# Patient Record
Sex: Male | Born: 1935 | Race: White | Hispanic: No | Marital: Single | Smoking: Never smoker
Health system: Southern US, Community
[De-identification: ages and names within clinical notes are randomized; demographics above are authoritative.]

## PROBLEM LIST (undated history)

## (undated) DIAGNOSIS — I499 Cardiac arrhythmia, unspecified: Secondary | ICD-10-CM

## (undated) DIAGNOSIS — G4730 Sleep apnea, unspecified: Secondary | ICD-10-CM

## (undated) DIAGNOSIS — N200 Calculus of kidney: Secondary | ICD-10-CM

## (undated) DIAGNOSIS — E119 Type 2 diabetes mellitus without complications: Secondary | ICD-10-CM

## (undated) DIAGNOSIS — M1990 Unspecified osteoarthritis, unspecified site: Secondary | ICD-10-CM

## (undated) DIAGNOSIS — I2510 Atherosclerotic heart disease of native coronary artery without angina pectoris: Secondary | ICD-10-CM

## (undated) DIAGNOSIS — B029 Zoster without complications: Secondary | ICD-10-CM

## (undated) DIAGNOSIS — I10 Essential (primary) hypertension: Secondary | ICD-10-CM

## (undated) DIAGNOSIS — D126 Benign neoplasm of colon, unspecified: Secondary | ICD-10-CM

## (undated) DIAGNOSIS — Z8719 Personal history of other diseases of the digestive system: Secondary | ICD-10-CM

## (undated) DIAGNOSIS — I429 Cardiomyopathy, unspecified: Secondary | ICD-10-CM

## (undated) DIAGNOSIS — I421 Obstructive hypertrophic cardiomyopathy: Secondary | ICD-10-CM

## (undated) DIAGNOSIS — M109 Gout, unspecified: Secondary | ICD-10-CM

## (undated) HISTORY — PX: ROTATOR CUFF REPAIR: SHX139

## (undated) HISTORY — PX: OTHER SURGICAL HISTORY: SHX169

## (undated) HISTORY — PX: CARDIAC DEFIBRILLATOR PLACEMENT: SHX171

## (undated) HISTORY — PX: CARDIAC CATHETERIZATION: SHX172

## (undated) HISTORY — PX: APPENDECTOMY: SHX54

## (undated) HISTORY — PX: COLONOSCOPY: SHX174

---

## 2004-09-10 ENCOUNTER — Ambulatory Visit: Payer: Self-pay | Admitting: Cardiology

## 2005-08-01 ENCOUNTER — Other Ambulatory Visit: Payer: Self-pay

## 2005-08-01 ENCOUNTER — Ambulatory Visit: Payer: Self-pay | Admitting: Urology

## 2005-08-04 ENCOUNTER — Ambulatory Visit: Payer: Self-pay | Admitting: Urology

## 2005-12-06 ENCOUNTER — Ambulatory Visit: Payer: Self-pay | Admitting: Nurse Practitioner

## 2007-07-08 ENCOUNTER — Other Ambulatory Visit: Payer: Self-pay

## 2007-07-08 ENCOUNTER — Inpatient Hospital Stay: Payer: Self-pay | Admitting: Internal Medicine

## 2007-07-26 ENCOUNTER — Inpatient Hospital Stay: Payer: Self-pay | Admitting: Cardiology

## 2007-07-27 ENCOUNTER — Other Ambulatory Visit: Payer: Self-pay

## 2007-08-29 ENCOUNTER — Ambulatory Visit: Payer: Self-pay | Admitting: Cardiology

## 2007-09-13 ENCOUNTER — Ambulatory Visit: Payer: Self-pay | Admitting: Gastroenterology

## 2007-09-14 ENCOUNTER — Ambulatory Visit: Payer: Self-pay | Admitting: Gastroenterology

## 2007-10-05 ENCOUNTER — Emergency Department: Payer: Self-pay | Admitting: Emergency Medicine

## 2007-10-05 ENCOUNTER — Other Ambulatory Visit: Payer: Self-pay

## 2007-10-06 ENCOUNTER — Ambulatory Visit: Payer: Self-pay | Admitting: Cardiology

## 2007-10-17 ENCOUNTER — Ambulatory Visit: Payer: Self-pay | Admitting: Cardiology

## 2007-10-31 ENCOUNTER — Encounter: Payer: Self-pay | Admitting: Family Medicine

## 2008-01-04 ENCOUNTER — Emergency Department: Payer: Self-pay | Admitting: Emergency Medicine

## 2008-04-11 ENCOUNTER — Ambulatory Visit: Payer: Self-pay | Admitting: Cardiology

## 2008-04-18 ENCOUNTER — Inpatient Hospital Stay: Payer: Self-pay | Admitting: Cardiology

## 2008-11-05 ENCOUNTER — Ambulatory Visit: Payer: Self-pay | Admitting: Cardiology

## 2008-12-09 ENCOUNTER — Ambulatory Visit: Payer: Self-pay | Admitting: Gastroenterology

## 2009-09-11 ENCOUNTER — Ambulatory Visit: Payer: Self-pay | Admitting: Family Medicine

## 2009-09-15 ENCOUNTER — Ambulatory Visit: Payer: Self-pay | Admitting: Family Medicine

## 2009-10-13 ENCOUNTER — Ambulatory Visit: Payer: Self-pay | Admitting: Family Medicine

## 2009-11-13 ENCOUNTER — Ambulatory Visit: Payer: Self-pay | Admitting: Family Medicine

## 2010-01-21 ENCOUNTER — Ambulatory Visit: Payer: Self-pay | Admitting: Rheumatology

## 2010-05-06 ENCOUNTER — Ambulatory Visit: Payer: Self-pay | Admitting: Family Medicine

## 2010-06-01 ENCOUNTER — Ambulatory Visit: Payer: Self-pay | Admitting: Urology

## 2010-06-10 ENCOUNTER — Ambulatory Visit: Payer: Self-pay | Admitting: Urology

## 2010-06-11 ENCOUNTER — Ambulatory Visit: Payer: Self-pay | Admitting: Cardiology

## 2010-08-30 ENCOUNTER — Ambulatory Visit: Payer: Self-pay | Admitting: Vascular Surgery

## 2010-09-14 NOTE — Assessment & Plan Note (Signed)
Summary: FLU SHOT/JBB  Nurse Visit   Immunizations Administered:  Influenza Vaccine:    Vaccine Type: Flulaval    Site: left deltoid    Mfr: GlaxoSmithKline    Dose: 0.5 ml    Route: IM    Given by: Tacey Ruiz MD    Exp. Date: 12/14/2010    Lot #: [id number]    VIS given: 03/09/10 version given May 06, 2010.  Orders Added: 1)  Flulaval Injection 3 yrs >IM [Q2036] 2)  Administration Flu vaccine - MCR [G0008]   Immunizations Administered:  Influenza Vaccine:    Vaccine Type: Flulaval    Site: left deltoid    Mfr: GlaxoSmithKline    Dose: 0.5 ml    Route: IM    Given by: Tacey Ruiz MD    Exp. Date: 12/14/2010    Lot #: [id number]    VIS given: 03/09/10 version given May 06, 2010.  Flu Vaccine Consent Questions:    Do you have a history of severe allergic reactions to this vaccine? no    Any prior history of allergic reactions to egg and/or gelatin? no    Do you have a sensitivity to the preservative Thimersol? no    Do you have a past history of Guillan-Barre Syndrome? no    Do you currently have an acute febrile illness? no    Have you ever had a severe reaction to latex? no    Vaccine information given and explained to patient? yes

## 2011-11-01 IMAGING — CT CT ANGIOGRAPHY ABDOMEN
2 of 5 series · 17 of 46 positions shown, 19 images · non-contrast
Comparison: none

REASON FOR EXAM: AAA
COMMENTS:

PROCEDURE:     KCT - KCT ANGIOGRAPHY ABDOMEN ONLY WWO  - August 30, 2010  [DATE]
RESULT:
HISTORY: Abdominal aortic aneurysm.

[Series 6: soft tissue · axial · 0.92mm/px · z∈[+200,+622]mm · 14 of 163 slices shown, 16 images]
[im 11/163  soft-tissue]
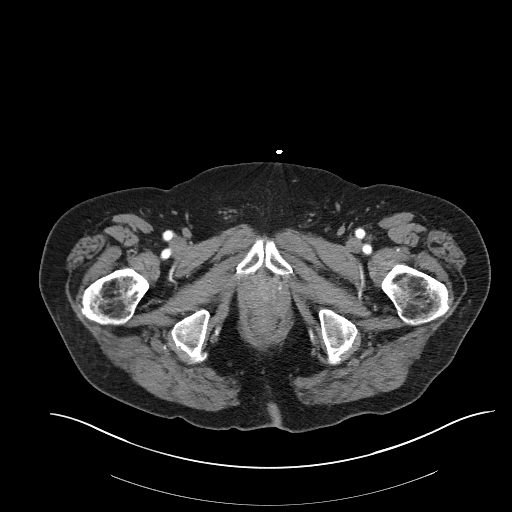
[im 11/163  bone]
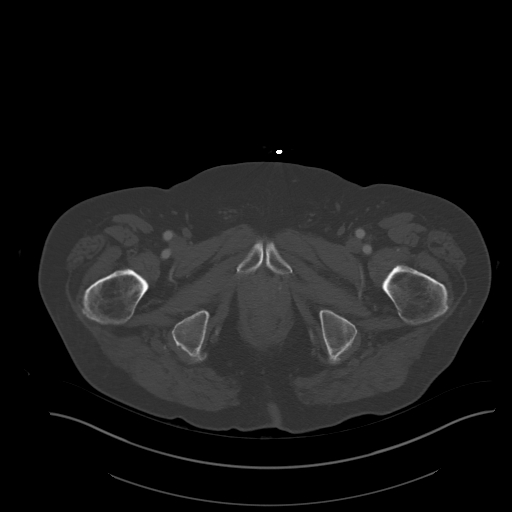
[im 21/163  soft-tissue]
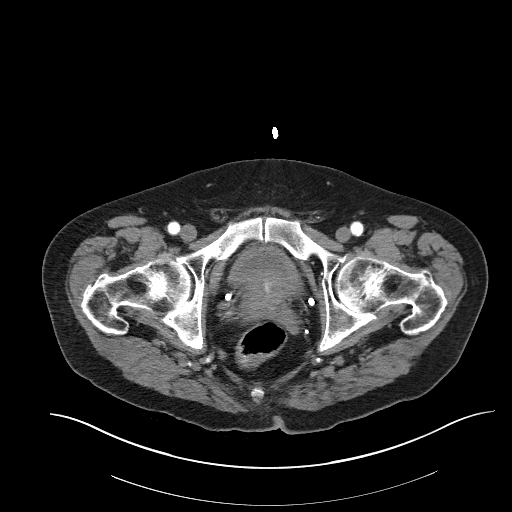
[im 31/163  soft-tissue]
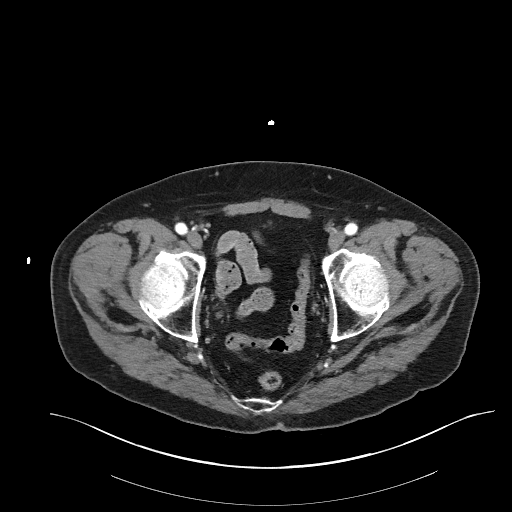
[im 41/163  soft-tissue]
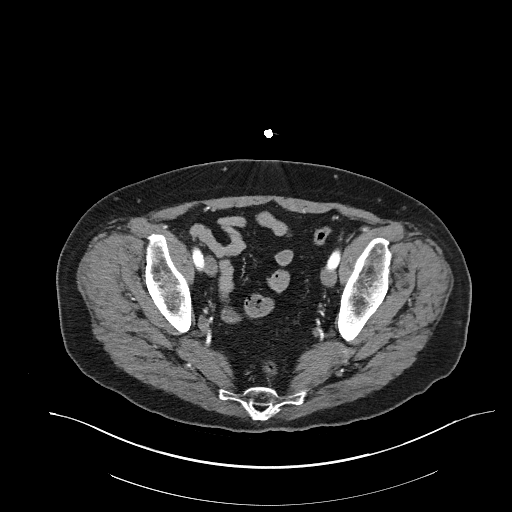
[im 51/163  soft-tissue]
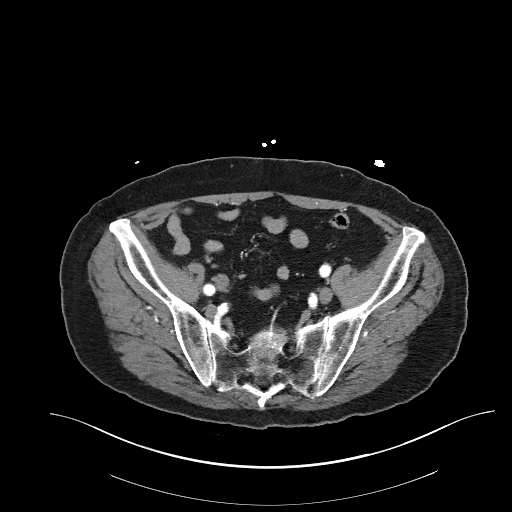
[im 61/163  soft-tissue]
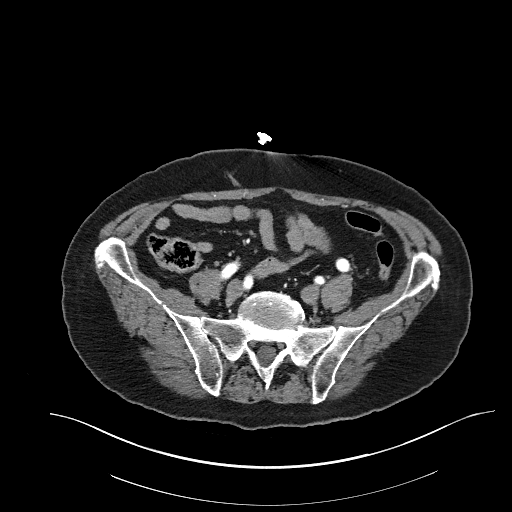
[im 71/163  soft-tissue]
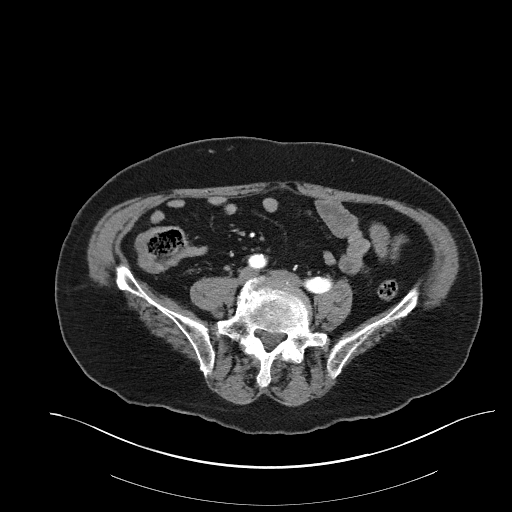
[im 92/163  soft-tissue]
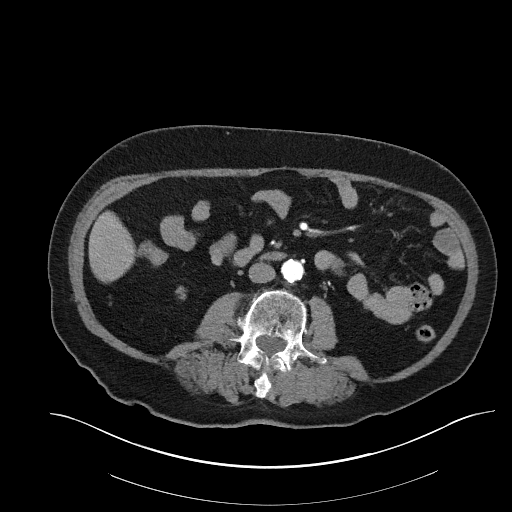
[im 102/163  soft-tissue]
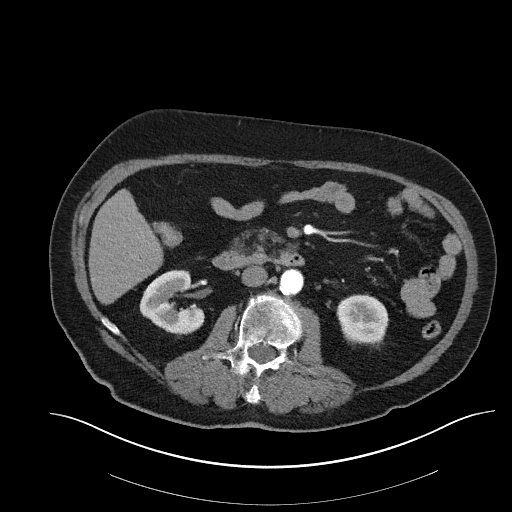
[im 102/163  bone]
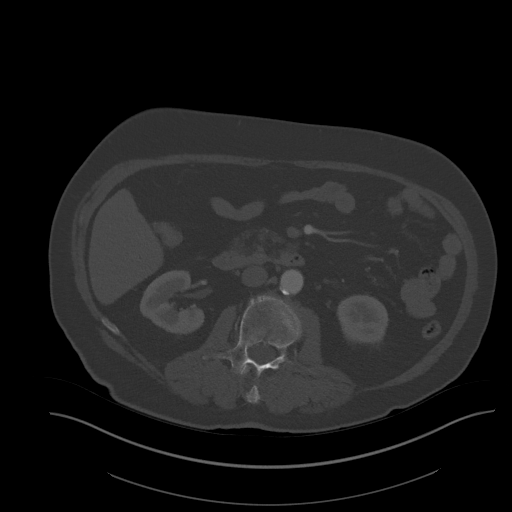
[im 112/163  soft-tissue]
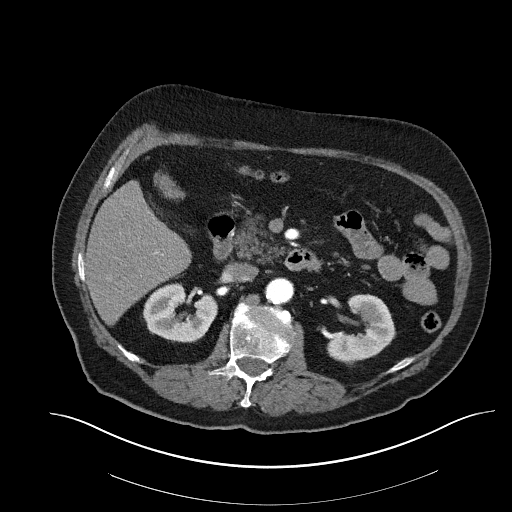
[im 122/163  soft-tissue]
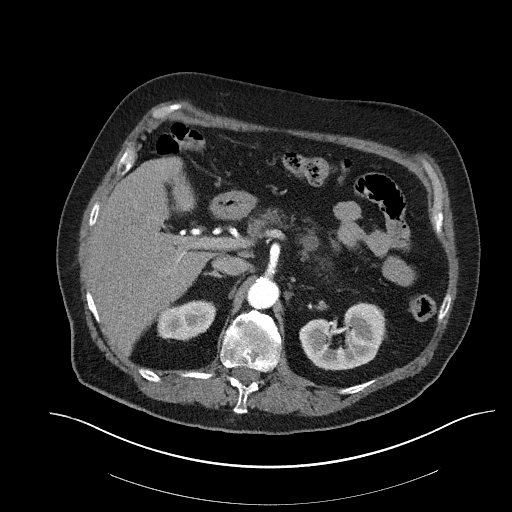
[im 132/163  soft-tissue]
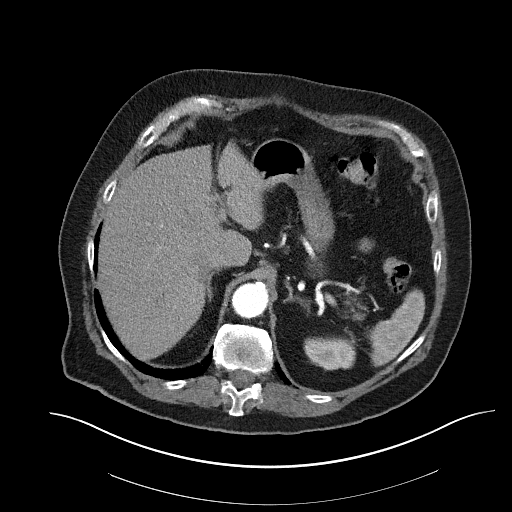
[im 142/163  soft-tissue]
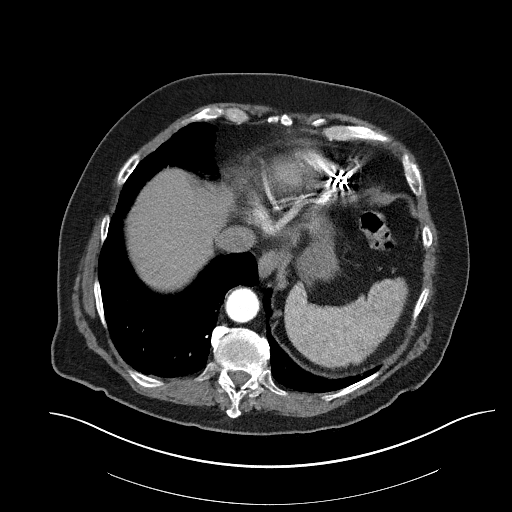
[im 152/163  soft-tissue]
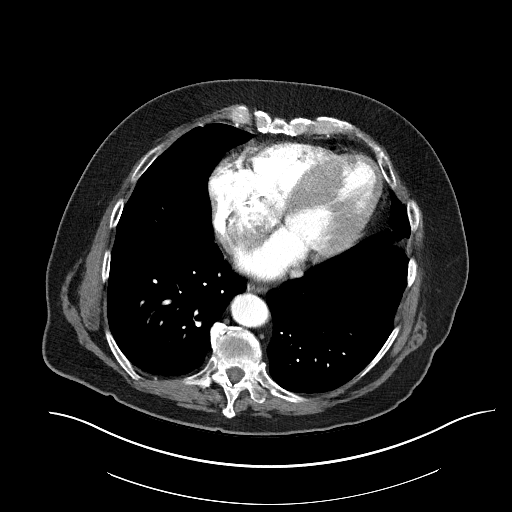

[Series 603: cor · coronal · 0.95mm/px · 3 of 60 slices shown]
[im 20/60  soft-tissue]
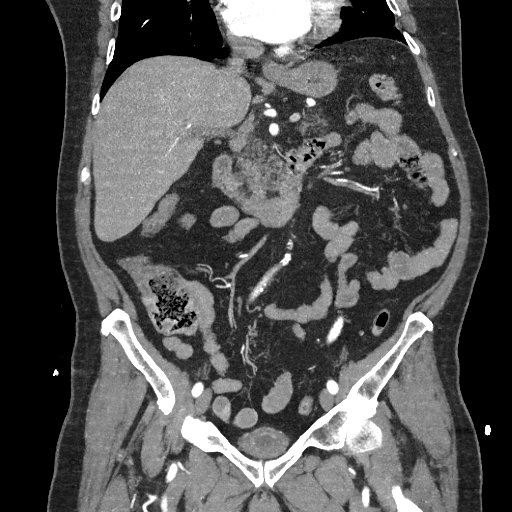
[im 27/60  soft-tissue]
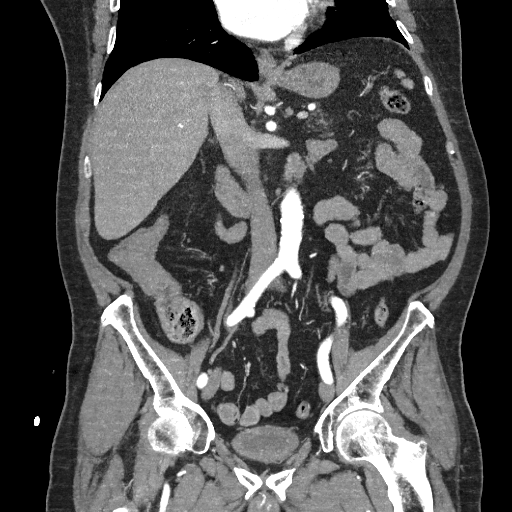
[im 33/60  soft-tissue]
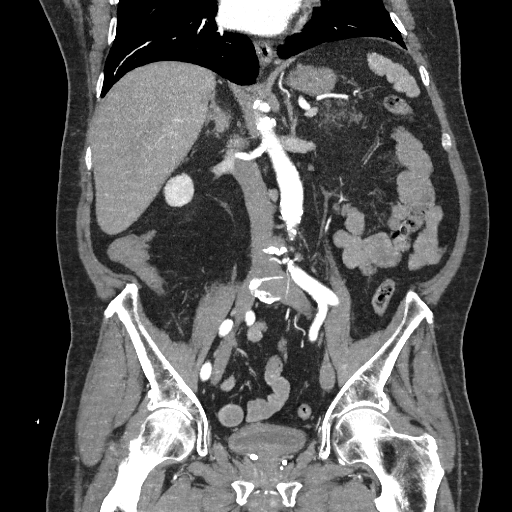

[17 of 46 positions shown; findings below may reference images not displayed]

PROCEDURE AND FINDINGS:  Standard IV contrast enhanced CT angiography of the
abdominal aorta is obtained with 100 ml of Wsovue-HZH. Axial source images,
MIP images and volume rendered images are obtained.

Comparison is made to prior CT of abdomen of 07/26/2007.

The patient has a pacemaker. Right nephrolithiasis is noted. The liver,
spleen and pancreas are unremarkable. The adrenals are unremarkable. No
focal significant renal cortical lesions are identified. Simple, right renal
cysts are present. Diffuse aortic and iliac atherosclerotic vascular disease
s present. No evidence of aneurysm. The single major renal arteries are
present and are patent with mild atherosclerotic vascular changes. The
visceral vessels are patent. The hypogastrics, external iliacs, common
iliacs and proximal SFA and deep femoral arteries are patent.
IMPRESSION: 1.  No significant aneurysm.
2.  Right nephrolithiasis. Tiny, right renal cysts are present. Mild
scarring is also noted in the right lower renal pole.

## 2012-06-19 ENCOUNTER — Ambulatory Visit: Payer: Self-pay | Admitting: Cardiology

## 2012-10-20 ENCOUNTER — Emergency Department: Payer: Self-pay | Admitting: Internal Medicine

## 2013-02-03 ENCOUNTER — Emergency Department: Payer: Self-pay | Admitting: Emergency Medicine

## 2013-02-03 LAB — URINALYSIS, COMPLETE
Bilirubin,UR: NEGATIVE
Glucose,UR: NEGATIVE mg/dL (ref 0–75)
Ketone: NEGATIVE
Leukocyte Esterase: NEGATIVE
Nitrite: POSITIVE
Protein: NEGATIVE
RBC,UR: 7 /HPF (ref 0–5)
Specific Gravity: 1.017 (ref 1.003–1.030)
Squamous Epithelial: <1
WBC UR: 2 /HPF (ref 0–5)

## 2013-02-04 LAB — URINE CULTURE

## 2013-12-19 ENCOUNTER — Ambulatory Visit: Payer: Self-pay | Admitting: Cardiology

## 2014-07-07 ENCOUNTER — Ambulatory Visit: Payer: Self-pay | Admitting: Orthopedic Surgery

## 2015-02-10 ENCOUNTER — Encounter
Admission: RE | Admit: 2015-02-10 | Discharge: 2015-02-10 | Disposition: A | Discharge status: Home / Self Care | Payer: Medicare Other | Source: Ambulatory Visit | Attending: Cardiology | Admitting: Cardiology

## 2015-02-10 ENCOUNTER — Ambulatory Visit
Admission: RE | Admit: 2015-02-10 | Discharge: 2015-02-10 | Disposition: A | Discharge status: Home / Self Care | Payer: Medicare Other | Source: Ambulatory Visit | Attending: Cardiology | Admitting: Cardiology

## 2015-02-10 DIAGNOSIS — I10 Essential (primary) hypertension: Secondary | ICD-10-CM | POA: Insufficient documentation

## 2015-02-10 DIAGNOSIS — Z01818 Encounter for other preprocedural examination: Secondary | ICD-10-CM | POA: Diagnosis not present

## 2015-02-10 DIAGNOSIS — I2510 Atherosclerotic heart disease of native coronary artery without angina pectoris: Secondary | ICD-10-CM | POA: Insufficient documentation

## 2015-02-10 DIAGNOSIS — Z7901 Long term (current) use of anticoagulants: Secondary | ICD-10-CM | POA: Insufficient documentation

## 2015-02-10 DIAGNOSIS — Z45010 Encounter for checking and testing of cardiac pacemaker pulse generator [battery]: Secondary | ICD-10-CM

## 2015-02-10 DIAGNOSIS — Z01810 Encounter for preprocedural cardiovascular examination: Secondary | ICD-10-CM | POA: Diagnosis not present

## 2015-02-10 DIAGNOSIS — Z01812 Encounter for preprocedural laboratory examination: Secondary | ICD-10-CM | POA: Diagnosis not present

## 2015-02-10 HISTORY — DX: Type 2 diabetes mellitus without complications: E11.9

## 2015-02-10 HISTORY — DX: Gout, unspecified: M10.9

## 2015-02-10 HISTORY — DX: Benign neoplasm of colon, unspecified: D12.6

## 2015-02-10 HISTORY — DX: Cardiomyopathy, unspecified: I42.9

## 2015-02-10 HISTORY — DX: Essential (primary) hypertension: I10

## 2015-02-10 HISTORY — DX: Zoster without complications: B02.9

## 2015-02-10 HISTORY — DX: Unspecified osteoarthritis, unspecified site: M19.90

## 2015-02-10 HISTORY — DX: Obstructive hypertrophic cardiomyopathy: I42.1

## 2015-02-10 HISTORY — DX: Calculus of kidney: N20.0

## 2015-02-10 HISTORY — DX: Personal history of other diseases of the digestive system: Z87.19

## 2015-02-10 HISTORY — DX: Cardiac arrhythmia, unspecified: I49.9

## 2015-02-10 HISTORY — DX: Sleep apnea, unspecified: G47.30

## 2015-02-10 HISTORY — DX: Atherosclerotic heart disease of native coronary artery without angina pectoris: I25.10

## 2015-02-10 LAB — CBC
HEMATOCRIT: 39.4 % — AB (ref 40.0–52.0)
Hemoglobin: 13.5 g/dL (ref 13.0–18.0)
MCH: 34.4 pg — ABNORMAL HIGH (ref 26.0–34.0)
MCHC: 34.3 g/dL (ref 32.0–36.0)
MCV: 100.2 fL — AB (ref 80.0–100.0)
Platelets: 134 10*3/uL — ABNORMAL LOW (ref 150–440)
RBC: 3.93 MIL/uL — AB (ref 4.40–5.90)
RDW: 13.1 % (ref 11.5–14.5)
WBC: 6.2 10*3/uL (ref 3.8–10.6)

## 2015-02-10 LAB — BASIC METABOLIC PANEL
Anion gap: 8 (ref 5–15)
BUN: 17 mg/dL (ref 6–20)
CO2: 25 mmol/L (ref 22–32)
Calcium: 9.6 mg/dL (ref 8.9–10.3)
Chloride: 108 mmol/L (ref 101–111)
Creatinine, Ser: 0.63 mg/dL (ref 0.61–1.24)
GFR calc non Af Amer: >60 mL/min (ref >60)
Glucose, Bld: 120 mg/dL — ABNORMAL HIGH (ref 65–99)
POTASSIUM: 4 mmol/L (ref 3.5–5.1)
Sodium: 141 mmol/L (ref 135–145)

## 2015-02-10 LAB — DIFFERENTIAL
BASOS PCT: 1 %
Basophils Absolute: 0.1 10*3/uL (ref 0–0.1)
Eosinophils Absolute: 0.1 10*3/uL (ref 0–0.7)
Eosinophils Relative: 2 %
LYMPHS ABS: 2.1 10*3/uL (ref 1.0–3.6)
Lymphocytes Relative: 34 %
MONO ABS: 0.8 10*3/uL (ref 0.2–1.0)
MONOS PCT: 12 %
NEUTROS ABS: 3.2 10*3/uL (ref 1.4–6.5)
Neutrophils Relative %: 51 %

## 2015-02-10 LAB — APTT: APTT: 37 s — AB (ref 24–36)

## 2015-02-10 LAB — PROTIME-INR
INR: 1.96
PROTHROMBIN TIME: 22.5 s — AB (ref 11.4–15.0)

## 2015-02-10 NOTE — Patient Instructions (Addendum)
  Your procedure is scheduled on: Thursday 02/19/15 Report to Day Surgery. 2ND FLOOR MEDICAL MALL ENTRANCE To find out your arrival time please call [phone] between 1PM - 3PM on Wednesday 02/18/15.  Remember: Instructions that are not followed completely may result in serious medical risk, up to and including death, or upon the discretion of your surgeon and anesthesiologist your surgery may need to be rescheduled.    __X__ 1. Do not eat food or drink liquids after midnight. No gum chewing or hard candies.     __X__ 2. No Alcohol for 24 hours before or after surgery.   ____ 3. Bring all medications with you on the day of surgery if instructed.    __X__ 4. Notify your doctor if there is any change in your medical condition     (cold, fever, infections).     Do not wear jewelry, make-up, hairpins, clips or nail polish.  Do not wear lotions, powders, or perfumes.   Do not shave 48 hours prior to surgery. Men may shave face and neck.  Do not bring valuables to the hospital.    [facility] is not responsible for any belongings or valuables.               Contacts, dentures or bridgework may not be worn into surgery.  Leave your suitcase in the car. After surgery it may be brought to your room.  For patients admitted to the hospital, discharge time is determined by your                treatment team.   Patients discharged the day of surgery will not be allowed to drive home.   Please read over the following fact sheets that you were given:   Surgical Site Infection Prevention   __X__ Take these medicines the morning of surgery with A SIP OF WATER:    1. METOPROLOL  2.   3.   4.  5.  6.  ____ Fleet Enema (as directed)   __X__ Use CHG Soap as directed  ____ Use inhalers on the day of surgery  __X__ Stop metformin 2 days prior to surgery    ____ Take 1/2 of usual insulin dose the night before surgery and none on the morning of surgery.   __X__ Stop Coumadin/Plavix/aspirin  on  TO STOP ON 7/1 WARFARIN AND ASPIRIN  ____ Stop Anti-inflammatories on    __X__ Stop supplements until after surgery.  OMEGA 3, B12, VITAMIN C  __X__ Bring C-Pap to the hospital.

## 2015-02-19 ENCOUNTER — Ambulatory Visit
Admission: RE | Admit: 2015-02-19 | Discharge: 2015-02-19 | Disposition: A | Discharge status: Home / Self Care | Payer: Medicare Other | Source: Ambulatory Visit | Attending: Cardiology | Admitting: Cardiology

## 2015-02-19 ENCOUNTER — Encounter
Admission: RE | Disposition: A | Discharge status: Home / Self Care | Payer: Self-pay | Source: Ambulatory Visit | Attending: Cardiology

## 2015-02-19 ENCOUNTER — Ambulatory Visit: Payer: Medicare Other | Admitting: Anesthesiology

## 2015-02-19 ENCOUNTER — Encounter: Payer: Self-pay | Admitting: *Deleted

## 2015-02-19 DIAGNOSIS — Z79899 Other long term (current) drug therapy: Secondary | ICD-10-CM | POA: Insufficient documentation

## 2015-02-19 DIAGNOSIS — E119 Type 2 diabetes mellitus without complications: Secondary | ICD-10-CM | POA: Insufficient documentation

## 2015-02-19 DIAGNOSIS — I421 Obstructive hypertrophic cardiomyopathy: Secondary | ICD-10-CM | POA: Diagnosis present

## 2015-02-19 DIAGNOSIS — Z8261 Family history of arthritis: Secondary | ICD-10-CM | POA: Diagnosis not present

## 2015-02-19 DIAGNOSIS — Z7901 Long term (current) use of anticoagulants: Secondary | ICD-10-CM | POA: Diagnosis not present

## 2015-02-19 DIAGNOSIS — I10 Essential (primary) hypertension: Secondary | ICD-10-CM | POA: Diagnosis not present

## 2015-02-19 DIAGNOSIS — E785 Hyperlipidemia, unspecified: Secondary | ICD-10-CM | POA: Diagnosis not present

## 2015-02-19 DIAGNOSIS — Z87442 Personal history of urinary calculi: Secondary | ICD-10-CM | POA: Insufficient documentation

## 2015-02-19 DIAGNOSIS — Z803 Family history of malignant neoplasm of breast: Secondary | ICD-10-CM | POA: Diagnosis not present

## 2015-02-19 DIAGNOSIS — I442 Atrioventricular block, complete: Secondary | ICD-10-CM | POA: Insufficient documentation

## 2015-02-19 DIAGNOSIS — G4730 Sleep apnea, unspecified: Secondary | ICD-10-CM | POA: Diagnosis not present

## 2015-02-19 DIAGNOSIS — I482 Chronic atrial fibrillation: Secondary | ICD-10-CM | POA: Diagnosis not present

## 2015-02-19 DIAGNOSIS — Z45010 Encounter for checking and testing of cardiac pacemaker pulse generator [battery]: Secondary | ICD-10-CM | POA: Diagnosis not present

## 2015-02-19 DIAGNOSIS — M109 Gout, unspecified: Secondary | ICD-10-CM | POA: Insufficient documentation

## 2015-02-19 DIAGNOSIS — I2510 Atherosclerotic heart disease of native coronary artery without angina pectoris: Secondary | ICD-10-CM | POA: Diagnosis not present

## 2015-02-19 DIAGNOSIS — Z8349 Family history of other endocrine, nutritional and metabolic diseases: Secondary | ICD-10-CM | POA: Diagnosis not present

## 2015-02-19 DIAGNOSIS — Z7982 Long term (current) use of aspirin: Secondary | ICD-10-CM | POA: Insufficient documentation

## 2015-02-19 DIAGNOSIS — Z86010 Personal history of colonic polyps: Secondary | ICD-10-CM | POA: Insufficient documentation

## 2015-02-19 DIAGNOSIS — Z833 Family history of diabetes mellitus: Secondary | ICD-10-CM | POA: Diagnosis not present

## 2015-02-19 DIAGNOSIS — Z8249 Family history of ischemic heart disease and other diseases of the circulatory system: Secondary | ICD-10-CM | POA: Diagnosis not present

## 2015-02-19 HISTORY — PX: PACEMAKER INSERTION: SHX728

## 2015-02-19 LAB — PROTIME-INR
INR: 1.16
PROTHROMBIN TIME: 15 s (ref 11.4–15.0)

## 2015-02-19 LAB — GLUCOSE, CAPILLARY
GLUCOSE-CAPILLARY: 149 mg/dL — AB (ref 65–99)
GLUCOSE-CAPILLARY: 120 mg/dL — AB (ref 65–99)

## 2015-02-19 SURGERY — INSERTION, CARDIAC PACEMAKER
Anesthesia: Monitor Anesthesia Care

## 2015-02-19 MED ORDER — CEFAZOLIN SODIUM 1-5 GM-% IV SOLN: 1.0000 g | Freq: Four times a day (QID) | INTRAVENOUS | Status: DC

## 2015-02-19 MED ORDER — ACETAMINOPHEN 325 MG PO TABS: 325.0000 mg | ORAL_TABLET | ORAL | Status: DC | PRN

## 2015-02-19 MED ORDER — GENTAMICIN SULFATE 40 MG/ML IJ SOLN
INTRAMUSCULAR | Status: AC
  Filled 2015-02-19: qty 2

## 2015-02-19 MED ORDER — SODIUM CHLORIDE 0.9 % IV SOLN
INTRAVENOUS | Status: DC
  Administered 2015-02-19: 11:00:00 via INTRAVENOUS

## 2015-02-19 MED ORDER — LIDOCAINE HCL (PF) 1 % IJ SOLN
INTRAMUSCULAR | Status: AC
  Filled 2015-02-19: qty 2

## 2015-02-19 MED ORDER — CEPHALEXIN 250 MG PO CAPS: 250.0000 mg | ORAL_CAPSULE | Freq: Four times a day (QID) | ORAL | Status: DC

## 2015-02-19 MED ORDER — ONDANSETRON HCL 4 MG/2ML IJ SOLN: 4.0000 mg | Freq: Four times a day (QID) | INTRAMUSCULAR | Status: DC | PRN

## 2015-02-19 MED ORDER — SODIUM CHLORIDE 0.9 % IR SOLN
Freq: Once | Status: DC
  Filled 2015-02-19: qty 2

## 2015-02-19 MED ORDER — CEFAZOLIN SODIUM 1-5 GM-% IV SOLN
1.0000 g | Freq: Once | INTRAVENOUS | Status: AC
  Administered 2015-02-19: 1 g via INTRAVENOUS

## 2015-02-19 MED ORDER — FAMOTIDINE 20 MG PO TABS
20.0000 mg | ORAL_TABLET | Freq: Once | ORAL | Status: AC
  Administered 2015-02-19: 20 mg via ORAL

## 2015-02-19 MED ORDER — LIDOCAINE HCL 1 % IJ SOLN
INTRAMUSCULAR | Status: DC | PRN
  Administered 2015-02-19: 30 mL

## 2015-02-19 MED ORDER — FENTANYL CITRATE (PF) 100 MCG/2ML IJ SOLN: 25.0000 ug | INTRAMUSCULAR | Status: DC | PRN

## 2015-02-19 MED ORDER — MIDAZOLAM HCL 2 MG/2ML IJ SOLN
INTRAMUSCULAR | Status: DC | PRN
  Administered 2015-02-19: 1 mg via INTRAVENOUS

## 2015-02-19 MED ORDER — ONDANSETRON HCL 4 MG/2ML IJ SOLN: 4.0000 mg | Freq: Once | INTRAMUSCULAR | Status: DC | PRN

## 2015-02-19 MED ORDER — FAMOTIDINE 20 MG PO TABS
ORAL_TABLET | ORAL | Status: AC
  Administered 2015-02-19: 20 mg via ORAL
  Filled 2015-02-19: qty 1

## 2015-02-19 MED ORDER — GENTAMICIN SULFATE 40 MG/ML IJ SOLN
INTRAMUSCULAR | Status: DC | PRN
  Administered 2015-02-19: 300 mL

## 2015-02-19 MED ORDER — CEFAZOLIN SODIUM 1-5 GM-% IV SOLN
INTRAVENOUS | Status: AC
  Administered 2015-02-19: 1 g via INTRAVENOUS
  Filled 2015-02-19: qty 50

## 2015-02-19 SURGICAL SUPPLY — 32 items
BAG DECANTER STRL (MISCELLANEOUS) ×3 IMPLANT
BENZOIN TINCTURE PRP APPL 2/3 (GAUZE/BANDAGES/DRESSINGS) ×3 IMPLANT
CABLE SURG 12 DISP A/V CHANNEL (MISCELLANEOUS) ×3 IMPLANT
CANISTER SUCT 1200ML W/VALVE (MISCELLANEOUS) ×3 IMPLANT
CHLORAPREP W/TINT 26ML (MISCELLANEOUS) ×3 IMPLANT
COVER LIGHT HANDLE STERIS (MISCELLANEOUS) ×6 IMPLANT
COVER MAYO STAND STRL (DRAPES) ×3 IMPLANT
DEVICE DISSECT PLASMABLAD 3.0S (MISCELLANEOUS) ×1 IMPLANT
DRESSING TELFA 4X3 1S ST N-ADH (GAUZE/BANDAGES/DRESSINGS) ×3 IMPLANT
DRSG TEGADERM 2-3/8X2-3/4 SM (GAUZE/BANDAGES/DRESSINGS) ×3 IMPLANT
DRSG TEGADERM 4X4.75 (GAUZE/BANDAGES/DRESSINGS) ×3 IMPLANT
EVERA XT DR ×3 IMPLANT
GLOVE BIO SURGEON STRL SZ7.5 (GLOVE) ×3 IMPLANT
GLOVE BIO SURGEON STRL SZ8 (GLOVE) ×3 IMPLANT
GOWN STRL REUS W/ TWL LRG LVL3 (GOWN DISPOSABLE) ×1 IMPLANT
GOWN STRL REUS W/ TWL XL LVL3 (GOWN DISPOSABLE) ×1 IMPLANT
GOWN STRL REUS W/TWL LRG LVL3 (GOWN DISPOSABLE) ×2
GOWN STRL REUS W/TWL XL LVL3 (GOWN DISPOSABLE) ×2
ICD EVERA XT DR DDBB1D1 (ICD Generator) ×2 IMPLANT
IV NS 1000ML (IV SOLUTION) ×2
IV NS 1000ML BAXH (IV SOLUTION) ×1 IMPLANT
KIT RM TURNOVER STRD PROC AR (KITS) ×3 IMPLANT
LABEL OR SOLS (LABEL) ×3 IMPLANT
MARKER SKIN W/RULER 31145785 (MISCELLANEOUS) ×3 IMPLANT
NEEDLE FILTER BLUNT 18X 1/2SAF (NEEDLE) ×2
NEEDLE FILTER BLUNT 18X1 1/2 (NEEDLE) ×1 IMPLANT
NS IRRIG 500ML POUR BTL (IV SOLUTION) ×3 IMPLANT
PACK PACE INSERTION (MISCELLANEOUS) ×3 IMPLANT
PAD GROUND ADULT SPLIT (MISCELLANEOUS) ×3 IMPLANT
PAD STATPAD (MISCELLANEOUS) ×3 IMPLANT
PLASMABLADE 3.0S (MISCELLANEOUS) ×3
SUT SILK 0 SH 30 (SUTURE) ×3 IMPLANT

## 2015-02-19 NOTE — Op Note (Signed)
[facility]   02/19/2015                     2:07 PM  PATIENT:  Evan Oliver    PRE-OPERATIVE DIAGNOSIS:  COMPLETE HEART BLOCK  POST-OPERATIVE DIAGNOSIS:  Same  PROCEDURE:  INSERTION PACEMAKER/  CHANGE OUT  SURGEON:  Keona Bilyeu, MD    ANESTHESIA:     PREOPERATIVE INDICATIONS:  NASEAN ZAPF is a  79 y.o. male with a diagnosis of Vina who failed conservative measures and elected for surgical management.    The risks benefits and alternatives were discussed with the patient preoperatively including but not limited to the risks of infection, bleeding, cardiopulmonary complications, the need for revision surgery, among others, and the patient was willing to proceed.   OPERATIVE PROCEDURE: The patient was brought to the operating room in a fasting state. The left pectoral region was prepped and draped in usual sterile manner. Local anesthesia was obtained 1% Xylocaine. A 6 cm incision was performed of the left pectoral region. Previous ICD generator was retrieved by electrocautery and blunt dissection. The leads were disconnected and connected to a new dual-chamber ICD generator (Medtronic Evera XT DR ). The pacemaker pocket was irrigated with gentamicin solution. The new ICD generator was positioned in the pocket and the pocket was closed with 2-0 and 4-0 Vicryl, respectively. Steri-Strips and a pressure dressing were applied.

## 2015-02-19 NOTE — Progress Notes (Signed)
Called dr Kayleen Memos about higher dbp   No new orders  Blood pressure at present 128/87

## 2015-02-19 NOTE — Interval H&P Note (Signed)
History and Physical Interval Note:  02/19/2015 9:08 AM  Evan Oliver  has presented today for surgery, with the diagnosis of COMPLETE HEART BLOCK  The various methods of treatment have been discussed with the patient and family. After consideration of risks, benefits and other options for treatment, the patient has consented to  Procedure(s): INSERTION PACEMAKER/  CHANGE OUT (N/A) as a surgical intervention .  The patient's history has been reviewed, patient examined, no change in status, stable for surgery.  I have reviewed the patient's chart and labs.  Questions were answered to the patient's satisfaction.     [name]

## 2015-02-19 NOTE — H&P (Signed)
Progress Notes   Evan Oliver (MR# [id number])        Progress Notes Info     Author Note Status Last Update User Last Update Date/Time Service Date   Evan Levans, MD Signed Evan Levans, MD Tue Feb 03, 2015 2:14 PM Tue Feb 03, 2015 2:11 PM    Progress Notes    Expand All Collapse All      Chief Complaint: No chief complaint on file.  Date of Service: 02/03/2015 Date of Birth: August 27, 1935 PCP: Evan Brandy Hale, MD  History of Present Illness: Mr. Evan Oliver is a 79 y.o.male patient who returns for follow up visit. Patient is doing fairly well from a cardiac standpoint. He has atrial fibrillation, hypertension as well as IHSS. He has a AICD in place for primary prevention. Interrogation today revealed it to be at the ERI. He is being seen today with regard to setting up for generator change out. He is on warfarin so will need to be off of this for 5 days prior to the generator change out. Risk and benefits of this procedure were discussed with patient. Past Medical and Surgical History  Past Medical History Past Medical History  Diagnosis Date  . Atrial fibrillation   . Hypertension   . Hyperlipidemia   . IHSS (idiopathic hypertrophic subaortic stenosis)   . Coronary atherosclerosis of native coronary artery   . Benign neoplasm of colon   . Diabetes mellitus type 2, uncomplicated   . Arthritis   . History of hepatitis   . Nephrolithiasis   . Gout   . Sleep apnea   . Shingles   . History of gastritis   . Cardiomyopathy, secondary     ihss    Past Surgical History He has past surgical history that includes Appendectomy; Right rotator cuff surgery (02/1999); Abdominal wall cyst x2 (11/1998 and 03/1997); Cardiac catheterization; Cardiac defibrillator placed; Colonoscopy (12/09/2008); and Insert / replace / remove pacemaker.   Medications and Allergies  Current Medications   Current Medications     Current Outpatient Prescriptions  Medication Sig Dispense Refill  . ascorbic acid (VITAMIN C) 1000 MG tablet Take 1,000 mg by mouth once daily.    Marland Kitchen aspirin 81 MG EC tablet Take 81 mg by mouth once daily.    Marland Kitchen atorvastatin (LIPITOR) 40 MG tablet Take 1 tablet (40 mg total) by mouth once daily. 90 tablet 3  . blood glucose diagnostic (ONETOUCH ULTRA TEST) test strip Use once daily. Use as instructed. 100 each 3  . blood glucose diagnostic test strip Use. Via meter once a day    . cyanocobalamin (VITAMIN B12) 1000 MCG tablet Take 1,000 mcg by mouth once daily.    . digoxin (LANOXIN) 0.125 MG tablet TAKE 1 TABLET EVERY DAY 90 tablet 2  . ferrous sulfate 325 (65 FE) MG tablet Take 325 mg by mouth daily with breakfast.    . fluticasone (FLONASE) 50 mcg/actuation nasal spray Place 2 sprays into both nostrils as needed.  0  . GLUC SU/CHONDRO SU A/VIT C/MN (GLUCOSAMINE 1500 COMPLEX ORAL) Take 1 tablet by mouth as directed.     . indomethacin (INDOCIN) 25 MG capsule Take 1 capsule (25 mg total) by mouth as needed. 30 capsule 0  . loratadine (CLARITIN) 10 mg tablet Take 10 mg by mouth as needed for Allergies.    . metFORMIN (GLUCOPHAGE-XR) 500 MG XR tablet Take 2 tablets (1,000 mg total) by mouth 2 (two) times daily. 360 tablet 1  .  metoprolol tartrate (LOPRESSOR) 50 MG tablet Take 50 mg by mouth 2 (two) times daily.    . multivitamin tablet Take 1 tablet by mouth once daily.    Marland Kitchen omega-3 fatty acids-vitamin E (FISH OIL) 1,000 mg Take 1,000 mg by mouth.    . oxyCODONE-acetaminophen (PERCOCET) 5-325 mg tablet Take 1 tablet by mouth every 6 (six) hours as needed for Pain. 30 tablet 0  . potassium 99 mg Tab Take by mouth.    . warfarin (COUMADIN) 1 MG tablet Take 1 tablet (1 mg total) by mouth once daily. (Patient taking differently: Take 1 mg by mouth. 1/2 tablet daily) 90 tablet 3  . warfarin (COUMADIN)  3 MG tablet Take 1 tablet (3 mg total) by mouth once daily. 90 tablet 3   No current facility-administered medications for this visit.      Allergies: Review of patient's allergies indicates no known allergies.  Social and Family History  Social History  reports that he has never smoked. He has never used smokeless tobacco. He reports that he does not drink alcohol or use illicit drugs.  Family History Family History  Problem Relation Age of Onset  . Heart attack Brother     bypass surgery in his late 17's.  . Coronary artery disease Brother   . Diabetes mellitus Brother   . Breast cancer Mother   . Diabetes mellitus Mother   . Heart disease Maternal Grandmother   . Coronary artery disease Maternal Grandmother   . Heart disease Maternal Grandfather   . Coronary artery disease Maternal Grandfather   . Gout Other   . Arthritis Other     Review of Systems  Review of Systems  Constitutional: Negative for fever, chills, weight loss, malaise/fatigue and diaphoresis.  HENT: Negative for congestion, ear discharge, hearing loss and tinnitus.  Eyes: Negative for blurred vision.  Respiratory: Negative for cough, hemoptysis, sputum production, shortness of breath and wheezing.  Cardiovascular: Positive for palpitations. Negative for chest pain, orthopnea, claudication, leg swelling and PND.  Gastrointestinal: Negative for heartburn, nausea, vomiting, abdominal pain, diarrhea, constipation, blood in stool and melena.  Genitourinary: Negative for dysuria, urgency, frequency and hematuria.  Musculoskeletal: Negative for myalgias, back pain, joint pain and falls.  Skin: Negative for itching and rash.  Neurological: Negative for dizziness, tingling, focal weakness, loss of consciousness, weakness and headaches.  Endo/Heme/Allergies: Negative for polydipsia. Does not bruise/bleed easily.  Psychiatric/Behavioral: Negative for  depression, memory loss and substance abuse. The patient is not nervous/anxious.    Physical Examination   Vitals:BP 122/68 mmHg  Pulse 62  Resp 10 Ht:  Wt:  QAS:TMHDQ is no height or weight on file to calculate BSA. There is no weight on file to calculate BMI.  Wt Readings from Last 3 Encounters:  01/28/15 100.245 kg (221 lb)  01/20/15 100.245 kg (221 lb)  01/14/15 100.608 kg (221 lb 12.8 oz)    BP Readings from Last 3 Encounters:  02/03/15 122/68  01/28/15 121/82  01/20/15 129/80    General appearance appears in no acute distress  Head Mouth and Eye exam Normocephalic, without obvious abnormality, atraumatic Dentition is good Eyes appear anicteric   Neck exam Thyroid: normal  Nodes: no obvious adenopathy  LUNGS Breath Sounds: Normal Percussion: Normal  CARDIOVASCULAR JVP CV wave: no HJR: no Elevation at 90 degrees: None Carotid Pulse: normal pulsation bilaterally Bruit: None Apex: apical impulse normal  Auscultation Rhythm: atrial fibrillation and normal pacemaker rhythm S1: normal S2: normal Clicks: no Rub: no Murmurs:  no murmurs  Gallop: None ABDOMEN Liver enlargement: no Pulsatile aorta: no Ascites: no Bruits: no  EXTREMITIES Clubbing: no Edema: trace to 1+ bilateral pedal edema Pulses: peripheral pulses symmetrical Femoral Bruits:  no Amputation: no SKIN Rash: no Cyanosis: no Embolic phemonenon: no Bruising: no NEURO Alert and Oriented to person, place and time: yes Non focal: yes  PSYCH: Pt appears to have normal affect   LABS REVIEWED Last 3 CBC results: Lab Results  Component Value Date   WBC 5.1 01/27/2015   WBC 4.9 07/15/2014   WBC 4.8 02/17/2014   Lab Results  Component Value Date   HGB 13.7* 01/27/2015   HGB 13.5* 07/15/2014   HGB 14.0* 02/17/2014   Lab Results  Component Value Date   HCT 39.4* 01/27/2015   HCT 39.6* 07/15/2014   HCT 39.2* 02/17/2014    Lab Results  Component Value Date   PLT 156 01/27/2015   PLT 136* 07/15/2014   PLT 131* 02/17/2014    Lab Results  Component Value Date   CREATININE 0.7 01/27/2015   BUN 19 01/27/2015   NA 141 01/27/2015   K 4.2 01/27/2015   CL 105 01/27/2015   CO2 29.1 01/27/2015    Lab Results  Component Value Date   HGBA1C 7.2* 01/27/2015    Lab Results  Component Value Date   HDL 41.5 07/15/2014   Lab Results  Component Value Date   LDLCALC 57 07/15/2014   Lab Results  Component Value Date   TRIG 79 07/15/2014     Lab Results  Component Value Date   ALT 16 01/27/2015   AST 19 01/27/2015   ALKPHOS 52 01/27/2015    Lab Results  Component Value Date   TSH 0.88 11/07/2007    Diagnostic Studies Reviewed:  EKG EKG demonstrated unchanged from previous tracings, atrial fibrillation, rate 78, intermitantly ventriuclar pacing. .    Assessment and Plan   79 y.o. male with    ICD-10-CM ICD-9-CM   1. Chronic a-fib rate is controlled with digoxin. I48.2 427.31   2. Chronic atrial fibrillation. Anticoagulated with warfarin with inr goal of 2-3 I48.2 427.31    3. Atherosclerosis of native coronary artery of native heart without angina pectoris-no evidence of ishcmeia I25.10 414.01   4. Essential hypertension, benign I10 401.1   5. IHSS (idiopathic hypertrophic subaortic stenosis)-stable. No syncope I42.1 425.11   6. Diabetes mellitus type 2, uncomplicated-treated with diet conrol E11.9 250.00   7. Obstructive sleep apnea syndrome-uses cpap daily and is gaining improvement.  G47.33 327.23    8. AICD the ERI. Will schedule generator change out. Will need to be off of warfarin 5 days and aspirin 5 days prior to the procedure.   Return in about 3 months (around 05/06/2015).   These notes generated with voice recognition software. I apologize for typographical errors.  Evan Oliver, [facility]   [address] [place] [address]    Service Location    Name Address       [address] [address]      Department    Name Address Phone Fax   [facility] [place] [address] [phone]

## 2015-02-19 NOTE — Anesthesia Preprocedure Evaluation (Signed)
Anesthesia Evaluation  Patient identified by MRN, date of birth, ID band Patient awake  General Assessment Comment:Hx of decreased BP during surgery  Reviewed: Allergy & Precautions, NPO status , Patient's Chart, lab work & pertinent test results  History of Anesthesia Complications (+) history of anesthetic complications  Airway Mallampati: II  TM Distance: >3 FB Neck ROM: Full    Dental no notable dental hx.    Pulmonary sleep apnea ,  breath sounds clear to auscultation  Pulmonary exam normal       Cardiovascular hypertension, Pt. on medications + CAD + dysrhythmias  Hx of IHSS and cardiomyopathy and Hx of complete heart block    Paced   Neuro/Psych negative psych ROS   GI/Hepatic   Endo/Other  diabetes, Well Controlled, Type 2  Renal/GU Hx of stones     Musculoskeletal  (+) Arthritis -, Osteoarthritis,    Abdominal Normal abdominal exam  (+)   Peds  Hematology negative hematology ROS (+)   Anesthesia Other Findings   Reproductive/Obstetrics                             Anesthesia Physical Anesthesia Plan  ASA: IV  Anesthesia Plan: MAC   Post-op Pain Management:    Induction: Intravenous  Airway Management Planned: Nasal Cannula  Additional Equipment:   Intra-op Plan:   Post-operative Plan:   Informed Consent: I have reviewed the patients History and Physical, chart, labs and discussed the procedure including the risks, benefits and alternatives for the proposed anesthesia with the patient or authorized representative who has indicated his/her understanding and acceptance.   Dental advisory given  Plan Discussed with: CRNA and Surgeon  Anesthesia Plan Comments:         Anesthesia Quick Evaluation

## 2015-02-19 NOTE — Discharge Instructions (Addendum)
AMBULATORY SURGERY  DISCHARGE INSTRUCTIONS   1) The drugs that you were given will stay in your system until tomorrow so for the next 24 hours you should not:  A) Drive an automobile B) Make any legal decisions C) Drink any alcoholic beverage   2) You may resume regular meals tomorrow.  Today it is better to start with liquids and gradually work up to solid foods.  You may eat anything you prefer, but it is better to start with liquids, then soup and crackers, and gradually work up to solid foods.   3) Please notify your doctor immediately if you have any unusual bleeding, trouble breathing, redness and pain at the surgery site, drainage, fever, or pain not relieved by medication.    4) Additional Instructions:     Pacemaker Battery Change, Care After Refer to this sheet in the next few weeks. These instructions provide you with information on caring for yourself after your procedure. Your health care provider may also give you more specific instructions. Your treatment has been planned according to current medical practices, but problems sometimes occur. Call your health care provider if you have any problems or questions after your procedure. WHAT TO EXPECT AFTER THE PROCEDURE After your procedure, it is typical to have the following sensations:  Soreness at the pacemaker site. HOME CARE INSTRUCTIONS   Keep the incision clean and dry.  Unless advised otherwise, you may shower beginning 48 hours after your procedure.  For the first week after the replacement, avoid stretching motions that pull at the incision site, and avoid heavy exercise with the arm that is on the same side as the incision.  Take medicines only as directed by your health care provider.  Keep all follow-up visits as directed by your health care provider. SEEK MEDICAL CARE IF:   You have pain at the incision site that is not relieved by over-the-counter or prescription medicine.  There is drainage or  pus from the incision site.  There is swelling larger than a lime at the incision site.  You develop red streaking that extends above or below the incision site.  You feel brief, intermittent palpitations, light-headedness, or any symptoms that you feel might be related to your heart. SEEK IMMEDIATE MEDICAL CARE IF:   You experience chest pain that is different than the pain at the pacemaker site.  You experience shortness of breath.  You have palpitations or irregular heartbeat.  You have light-headedness that does not go away quickly.  You faint.  You have pain that gets worse and is not relieved by medicine. Document Released: 05/22/2013 Document Revised: 12/16/2013 Document Reviewed: 05/22/2013 [facility] Patient Information 2015 [place], [place]. This information is not intended to replace advice given to you by your health care provider. Make sure you discuss any questions you have with your health care provider.    Please contact your physician with any problems or Same Day Surgery at [phone], Monday through Friday 6 am to 4 pm, or [place] at [facility] number at [phone].

## 2015-02-19 NOTE — Transfer of Care (Signed)
Immediate Anesthesia Transfer of Care Note  Patient: Evan Oliver  Procedure(s) Performed: Procedure(s): INSERTION PACEMAKER/  CHANGE OUT (N/A)  Patient Location: PACU  Anesthesia Type:MAC  Level of Consciousness: awake, alert , oriented and patient cooperative  Airway & Oxygen Therapy: Patient Spontanous Breathing and Patient connected to nasal cannula oxygen  Post-op Assessment: Report given to RN, Post -op Vital signs reviewed and stable and Patient moving all extremities X 4  Post vital signs: Reviewed and stable  Last Vitals:  Filed Vitals:   02/19/15 1407  BP: 135/94  Pulse: 61  Temp: 36 C  Resp: 20    Complications: No apparent anesthesia complications

## 2015-02-19 NOTE — Anesthesia Postprocedure Evaluation (Signed)
  Anesthesia Post-op Note  Patient: Evan Oliver  Procedure(s) Performed: Procedure(s): INSERTION PACEMAKER/  CHANGE OUT (N/A)  Anesthesia type:MAC  Patient location: PACU  Post pain: Pain level controlled  Post assessment: Post-op Vital signs reviewed, Patient's Cardiovascular Status Stable, Respiratory Function Stable, Patent Airway and No signs of Nausea or vomiting  Post vital signs: Reviewed and stable  Last Vitals:  Filed Vitals:   02/19/15 1432  BP: 132/99  Pulse: 63  Temp:   Resp: 14    Level of consciousness: awake, alert  and patient cooperative  Complications: No apparent anesthesia complications

## 2015-02-28 ENCOUNTER — Inpatient Hospital Stay (HOSPITAL_COMMUNITY): Payer: Medicare Other

## 2015-02-28 ENCOUNTER — Inpatient Hospital Stay (HOSPITAL_COMMUNITY)
Admission: EM | Admit: 2015-02-28 | Discharge: 2015-03-03 | DRG: 023 | Disposition: A | Discharge status: Hospice | Payer: Medicare Other | Attending: Neurology | Admitting: Neurology

## 2015-02-28 ENCOUNTER — Emergency Department (HOSPITAL_COMMUNITY): Payer: Medicare Other

## 2015-02-28 ENCOUNTER — Encounter (HOSPITAL_COMMUNITY)
Admission: EM | Disposition: A | Discharge status: Hospice | Payer: Self-pay | Source: Home / Self Care | Attending: Neurology

## 2015-02-28 ENCOUNTER — Inpatient Hospital Stay (HOSPITAL_COMMUNITY): Payer: Medicare Other | Admitting: Anesthesiology

## 2015-02-28 ENCOUNTER — Encounter (HOSPITAL_COMMUNITY): Payer: Self-pay | Admitting: Anesthesiology

## 2015-02-28 DIAGNOSIS — Z515 Encounter for palliative care: Secondary | ICD-10-CM | POA: Diagnosis not present

## 2015-02-28 DIAGNOSIS — I639 Cerebral infarction, unspecified: Secondary | ICD-10-CM | POA: Diagnosis not present

## 2015-02-28 DIAGNOSIS — R4182 Altered mental status, unspecified: Secondary | ICD-10-CM | POA: Diagnosis present

## 2015-02-28 DIAGNOSIS — Z7982 Long term (current) use of aspirin: Secondary | ICD-10-CM

## 2015-02-28 DIAGNOSIS — Z95810 Presence of automatic (implantable) cardiac defibrillator: Secondary | ICD-10-CM

## 2015-02-28 DIAGNOSIS — I4891 Unspecified atrial fibrillation: Secondary | ICD-10-CM | POA: Diagnosis present

## 2015-02-28 DIAGNOSIS — M109 Gout, unspecified: Secondary | ICD-10-CM | POA: Diagnosis present

## 2015-02-28 DIAGNOSIS — J9690 Respiratory failure, unspecified, unspecified whether with hypoxia or hypercapnia: Secondary | ICD-10-CM | POA: Diagnosis present

## 2015-02-28 DIAGNOSIS — I63511 Cerebral infarction due to unspecified occlusion or stenosis of right middle cerebral artery: Secondary | ICD-10-CM | POA: Diagnosis present

## 2015-02-28 DIAGNOSIS — Z6828 Body mass index (BMI) 28.0-28.9, adult: Secondary | ICD-10-CM

## 2015-02-28 DIAGNOSIS — I10 Essential (primary) hypertension: Secondary | ICD-10-CM | POA: Diagnosis present

## 2015-02-28 DIAGNOSIS — Z66 Do not resuscitate: Secondary | ICD-10-CM | POA: Diagnosis present

## 2015-02-28 DIAGNOSIS — G936 Cerebral edema: Secondary | ICD-10-CM | POA: Diagnosis present

## 2015-02-28 DIAGNOSIS — D696 Thrombocytopenia, unspecified: Secondary | ICD-10-CM | POA: Diagnosis present

## 2015-02-28 DIAGNOSIS — I421 Obstructive hypertrophic cardiomyopathy: Secondary | ICD-10-CM | POA: Diagnosis present

## 2015-02-28 DIAGNOSIS — R0600 Dyspnea, unspecified: Secondary | ICD-10-CM | POA: Diagnosis not present

## 2015-02-28 DIAGNOSIS — Z7901 Long term (current) use of anticoagulants: Secondary | ICD-10-CM

## 2015-02-28 DIAGNOSIS — J9600 Acute respiratory failure, unspecified whether with hypoxia or hypercapnia: Secondary | ICD-10-CM | POA: Diagnosis not present

## 2015-02-28 DIAGNOSIS — Z79891 Long term (current) use of opiate analgesic: Secondary | ICD-10-CM

## 2015-02-28 DIAGNOSIS — Z79899 Other long term (current) drug therapy: Secondary | ICD-10-CM

## 2015-02-28 DIAGNOSIS — J988 Other specified respiratory disorders: Secondary | ICD-10-CM

## 2015-02-28 DIAGNOSIS — G8194 Hemiplegia, unspecified affecting left nondominant side: Secondary | ICD-10-CM | POA: Diagnosis present

## 2015-02-28 DIAGNOSIS — I2510 Atherosclerotic heart disease of native coronary artery without angina pectoris: Secondary | ICD-10-CM | POA: Diagnosis present

## 2015-02-28 DIAGNOSIS — R29810 Facial weakness: Secondary | ICD-10-CM | POA: Diagnosis present

## 2015-02-28 DIAGNOSIS — E119 Type 2 diabetes mellitus without complications: Secondary | ICD-10-CM | POA: Diagnosis present

## 2015-02-28 DIAGNOSIS — Z978 Presence of other specified devices: Secondary | ICD-10-CM

## 2015-02-28 DIAGNOSIS — G4733 Obstructive sleep apnea (adult) (pediatric): Secondary | ICD-10-CM | POA: Diagnosis present

## 2015-02-28 HISTORY — PX: RADIOLOGY WITH ANESTHESIA: SHX6223

## 2015-02-28 LAB — CBC
HCT: 37 % — ABNORMAL LOW (ref 39.0–52.0)
HEMOGLOBIN: 13 g/dL (ref 13.0–17.0)
MCH: 34.8 pg — ABNORMAL HIGH (ref 26.0–34.0)
MCHC: 35.1 g/dL (ref 30.0–36.0)
MCV: 98.9 fL (ref 78.0–100.0)
Platelets: 142 10*3/uL — ABNORMAL LOW (ref 150–400)
RBC: 3.74 MIL/uL — ABNORMAL LOW (ref 4.22–5.81)
RDW: 12.8 % (ref 11.5–15.5)
WBC: 5.6 10*3/uL (ref 4.0–10.5)

## 2015-02-28 LAB — COMPREHENSIVE METABOLIC PANEL
ALK PHOS: 59 U/L (ref 38–126)
ALT: 21 U/L (ref 17–63)
AST: 24 U/L (ref 15–41)
Albumin: 3.4 g/dL — ABNORMAL LOW (ref 3.5–5.0)
Anion gap: 10 (ref 5–15)
BUN: 14 mg/dL (ref 6–20)
CALCIUM: 9.3 mg/dL (ref 8.9–10.3)
CO2: 23 mmol/L (ref 22–32)
Chloride: 108 mmol/L (ref 101–111)
Creatinine, Ser: 0.76 mg/dL (ref 0.61–1.24)
GFR calc non Af Amer: >60 mL/min (ref >60)
GLUCOSE: 106 mg/dL — AB (ref 65–99)
POTASSIUM: 4.2 mmol/L (ref 3.5–5.1)
SODIUM: 141 mmol/L (ref 135–145)
Total Bilirubin: 1 mg/dL (ref 0.3–1.2)
Total Protein: 6.7 g/dL (ref 6.5–8.1)

## 2015-02-28 LAB — BLOOD GAS, ARTERIAL
ACID-BASE DEFICIT: 0.7 mmol/L (ref 0.0–2.0)
BICARBONATE: 23.3 meq/L (ref 20.0–24.0)
Drawn by: 283401
FIO2: 40 %
MECHVT: 640 mL
O2 Saturation: 98.7 %
PEEP/CPAP: 5 cmH2O
PO2 ART: 137 mmHg — AB (ref 80.0–100.0)
Patient temperature: 98.6
RATE: 14 resp/min
TCO2: 24.4 mmol/L (ref 0–100)
pCO2 arterial: 37.4 mmHg (ref 35.0–45.0)
pH, Arterial: 7.41 (ref 7.350–7.450)

## 2015-02-28 LAB — I-STAT CHEM 8, ED
BUN: 17 mg/dL (ref 6–20)
Calcium, Ion: 1.17 mmol/L (ref 1.13–1.30)
Chloride: 105 mmol/L (ref 101–111)
Creatinine, Ser: 0.7 mg/dL (ref 0.61–1.24)
GLUCOSE: 104 mg/dL — AB (ref 65–99)
HEMATOCRIT: 40 % (ref 39.0–52.0)
HEMOGLOBIN: 13.6 g/dL (ref 13.0–17.0)
POTASSIUM: 4.1 mmol/L (ref 3.5–5.1)
Sodium: 142 mmol/L (ref 135–145)
TCO2: 23 mmol/L (ref 0–100)

## 2015-02-28 LAB — DIFFERENTIAL
Basophils Absolute: 0 10*3/uL (ref 0.0–0.1)
Basophils Relative: 0 % (ref 0–1)
Eosinophils Absolute: 0.2 10*3/uL (ref 0.0–0.7)
Eosinophils Relative: 3 % (ref 0–5)
LYMPHS ABS: 2.3 10*3/uL (ref 0.7–4.0)
LYMPHS PCT: 42 % (ref 12–46)
Monocytes Absolute: 0.7 10*3/uL (ref 0.1–1.0)
Monocytes Relative: 12 % (ref 3–12)
NEUTROS PCT: 43 % (ref 43–77)
Neutro Abs: 2.4 10*3/uL (ref 1.7–7.7)

## 2015-02-28 LAB — CBG MONITORING, ED: Glucose-Capillary: 97 mg/dL (ref 65–99)

## 2015-02-28 LAB — I-STAT TROPONIN, ED: Troponin i, poc: 0.09 ng/mL (ref 0.00–0.08)

## 2015-02-28 LAB — PROTIME-INR
INR: 1.54 — ABNORMAL HIGH (ref 0.00–1.49)
PROTHROMBIN TIME: 18.6 s — AB (ref 11.6–15.2)

## 2015-02-28 LAB — GLUCOSE, CAPILLARY: Glucose-Capillary: 141 mg/dL — ABNORMAL HIGH (ref 65–99)

## 2015-02-28 LAB — APTT: aPTT: 33 seconds (ref 24–37)

## 2015-02-28 SURGERY — RADIOLOGY WITH ANESTHESIA
Anesthesia: General

## 2015-02-28 MED ORDER — SODIUM CHLORIDE 0.9 % IV SOLN
INTRAVENOUS | Status: DC
  Administered 2015-02-28 – 2015-03-01 (×2): via INTRAVENOUS

## 2015-02-28 MED ORDER — SODIUM CHLORIDE 0.9 % IV SOLN
50.0000 mL | Freq: Once | INTRAVENOUS | Status: AC
  Administered 2015-02-28 (×2): via INTRAVENOUS

## 2015-02-28 MED ORDER — PROPOFOL 10 MG/ML IV BOLUS
INTRAVENOUS | Status: DC | PRN
  Administered 2015-02-28: 150 mg via INTRAVENOUS

## 2015-02-28 MED ORDER — NITROGLYCERIN 1 MG/10 ML FOR IR/CATH LAB
INTRA_ARTERIAL | Status: AC
  Filled 2015-02-28: qty 10

## 2015-02-28 MED ORDER — SUCCINYLCHOLINE CHLORIDE 20 MG/ML IJ SOLN
INTRAMUSCULAR | Status: DC | PRN
  Administered 2015-02-28: 120 mg via INTRAVENOUS

## 2015-02-28 MED ORDER — STROKE: EARLY STAGES OF RECOVERY BOOK
Freq: Once | Status: AC
  Administered 2015-02-28: 22:00:00
  Filled 2015-02-28: qty 1

## 2015-02-28 MED ORDER — PROPOFOL INFUSION 10 MG/ML OPTIME
INTRAVENOUS | Status: DC | PRN
  Administered 2015-02-28: 25 ug/kg/min via INTRAVENOUS

## 2015-02-28 MED ORDER — ACETAMINOPHEN 500 MG PO TABS: 1000.0000 mg | ORAL_TABLET | Freq: Four times a day (QID) | ORAL | Status: DC | PRN

## 2015-02-28 MED ORDER — LABETALOL HCL 5 MG/ML IV SOLN: 10.0000 mg | INTRAVENOUS | Status: DC | PRN

## 2015-02-28 MED ORDER — IPRATROPIUM-ALBUTEROL 0.5-2.5 (3) MG/3ML IN SOLN
3.0000 mL | Freq: Four times a day (QID) | RESPIRATORY_TRACT | Status: DC
  Administered 2015-02-28 – 2015-03-01 (×2): 3 mL via RESPIRATORY_TRACT
  Filled 2015-02-28 (×2): qty 3

## 2015-02-28 MED ORDER — ALTEPLASE (STROKE) FULL DOSE INFUSION
0.9000 mg/kg | Freq: Once | INTRAVENOUS | Status: AC
  Administered 2015-02-28: 90 mg via INTRAVENOUS
  Filled 2015-02-28: qty 90

## 2015-02-28 MED ORDER — ACETAMINOPHEN 650 MG RE SUPP: 650.0000 mg | Freq: Four times a day (QID) | RECTAL | Status: DC | PRN

## 2015-02-28 MED ORDER — ROCURONIUM BROMIDE 100 MG/10ML IV SOLN
INTRAVENOUS | Status: DC | PRN
  Administered 2015-02-28: 20 mg via INTRAVENOUS
  Administered 2015-02-28: 50 mg via INTRAVENOUS

## 2015-02-28 MED ORDER — FENTANYL CITRATE (PF) 250 MCG/5ML IJ SOLN
INTRAMUSCULAR | Status: DC | PRN
  Administered 2015-02-28 (×2): 100 ug via INTRAVENOUS
  Administered 2015-02-28: 50 ug via INTRAVENOUS

## 2015-02-28 MED ORDER — CEFAZOLIN SODIUM-DEXTROSE 2-3 GM-% IV SOLR
INTRAVENOUS | Status: AC
  Administered 2015-02-28: 2 g via INTRAVENOUS
  Filled 2015-02-28: qty 50

## 2015-02-28 MED ORDER — ONDANSETRON HCL 4 MG/2ML IJ SOLN: 4.0000 mg | Freq: Four times a day (QID) | INTRAMUSCULAR | Status: DC | PRN

## 2015-02-28 MED ORDER — ACETAMINOPHEN 325 MG PO TABS: 650.0000 mg | ORAL_TABLET | ORAL | Status: DC | PRN

## 2015-02-28 MED ORDER — SODIUM CHLORIDE 0.9 % IV SOLN: INTRAVENOUS | Status: DC

## 2015-02-28 MED ORDER — LIDOCAINE HCL (CARDIAC) 20 MG/ML IV SOLN
INTRAVENOUS | Status: DC | PRN
  Administered 2015-02-28: 100 mg via INTRAVENOUS

## 2015-02-28 MED ORDER — IOHEXOL 300 MG/ML  SOLN
150.0000 mL | Freq: Once | INTRAMUSCULAR | Status: AC | PRN
  Administered 2015-02-28: 80 mL via INTRAVENOUS

## 2015-02-28 MED ORDER — PHENYLEPHRINE HCL 10 MG/ML IJ SOLN
10.0000 mg | INTRAMUSCULAR | Status: DC | PRN
  Administered 2015-02-28: 40 ug/min via INTRAVENOUS

## 2015-02-28 MED ORDER — CHLORHEXIDINE GLUCONATE 0.12 % MT SOLN
15.0000 mL | Freq: Two times a day (BID) | OROMUCOSAL | Status: DC
  Administered 2015-02-28 – 2015-03-01 (×2): 15 mL via OROMUCOSAL
  Filled 2015-02-28 (×2): qty 15

## 2015-02-28 MED ORDER — ALTEPLASE 30 MG/30 ML FOR INTERV. RAD
1.0000 mg | INTRA_ARTERIAL | Status: AC | PRN
Start: 2015-02-28 — End: 2015-02-28
  Administered 2015-02-28: 5 mg via INTRA_ARTERIAL
  Administered 2015-02-28: 3 mg via INTRA_ARTERIAL
  Administered 2015-02-28: 2 mg via INTRA_ARTERIAL
  Filled 2015-02-28: qty 30

## 2015-02-28 MED ORDER — ACETAMINOPHEN 650 MG RE SUPP: 650.0000 mg | RECTAL | Status: DC | PRN

## 2015-02-28 MED ORDER — PROPOFOL 1000 MG/100ML IV EMUL
INTRAVENOUS | Status: AC
  Filled 2015-02-28: qty 100

## 2015-02-28 MED ORDER — PANTOPRAZOLE SODIUM 40 MG IV SOLR
40.0000 mg | Freq: Every day | INTRAVENOUS | Status: DC
  Administered 2015-02-28: 40 mg via INTRAVENOUS
  Filled 2015-02-28 (×2): qty 40

## 2015-02-28 MED ORDER — NICARDIPINE HCL IN NACL 20-0.86 MG/200ML-% IV SOLN
5.0000 mg/h | INTRAVENOUS | Status: DC
  Administered 2015-02-28: 2 mg/h via INTRAVENOUS
  Filled 2015-02-28: qty 200

## 2015-02-28 MED ORDER — PROPOFOL 1000 MG/100ML IV EMUL
5.0000 ug/kg/min | INTRAVENOUS | Status: DC
  Administered 2015-02-28: 80 ug/kg/min via INTRAVENOUS
  Administered 2015-02-28: 70 ug/kg/min via INTRAVENOUS
  Administered 2015-03-01: 40 ug/kg/min via INTRAVENOUS
  Filled 2015-02-28: qty 200
  Filled 2015-02-28: qty 100

## 2015-02-28 NOTE — ED Notes (Addendum)
Dr. Drenda Freeze notified that PT and INR results are back

## 2015-02-28 NOTE — Transfer of Care (Signed)
Immediate Anesthesia Transfer of Care Note  Patient: Evan Oliver  Procedure(s) Performed: Procedure(s): RADIOLOGY WITH ANESTHESIA (N/A)  Patient Location: ICU  Anesthesia Type:General  Level of Consciousness: Patient remains intubated per anesthesia plan  Airway & Oxygen Therapy: Patient remains intubated per anesthesia plan, Patient placed on Ventilator (see vital sign flow sheet for setting) and to 3105  Post-op Assessment: Report given to RN and Post -op Vital signs reviewed and stable  Post vital signs: Reviewed and stable  Last Vitals:  Filed Vitals:   02/28/15 1630  BP: 108/31  Pulse:   Temp:   Resp:     Complications: No apparent anesthesia complications

## 2015-02-28 NOTE — Anesthesia Preprocedure Evaluation (Addendum)
Anesthesia Evaluation  Patient identified by MRN, date of birth, ID band Patient unresponsive    Reviewed: Allergy & Precautions, Patient's Chart, lab work & pertinent test results, Unable to perform ROS - Chart review only  Airway Mallampati: I  TM Distance: >3 FB Neck ROM: Full    Dental  (+) Edentulous Upper, Edentulous Lower   Pulmonary sleep apnea ,    Pulmonary exam normal       Cardiovascular hypertension, Pt. on medications and Pt. on home beta blockers + CAD + dysrhythmias Atrial Fibrillation + pacemaker + Cardiac Defibrillator Rhythm:Irregular Rate:Tachycardia     Neuro/Psych Seizures -,  CVA    GI/Hepatic   Endo/Other  diabetes, Type 2  Renal/GU Renal disease     Musculoskeletal  (+) Arthritis -,   Abdominal   Peds  Hematology   Anesthesia Other Findings IHSS  Reproductive/Obstetrics                            Anesthesia Physical Anesthesia Plan  ASA: IV and emergent  Anesthesia Plan: General   Post-op Pain Management:    Induction: Intravenous, Rapid sequence and Cricoid pressure planned  Airway Management Planned: Oral ETT  Additional Equipment: Arterial line  Intra-op Plan:   Post-operative Plan: Post-operative intubation/ventilation  Informed Consent:   History available from chart only  Plan Discussed with: CRNA, Anesthesiologist and Surgeon  Anesthesia Plan Comments:        Anesthesia Quick Evaluation

## 2015-02-28 NOTE — H&P (Addendum)
Neurology H&P  CC: left sided weakness  History is obtained from:patient, daughter  HPI: Evan Oliver is a 79 y.o. male who was walkign across a field when he was seen to collapse at 14:40 this afternoon. He was brought in via EMS and was seen to have left sided weakness.   He had his coumadin stopped for several days prior to a procedure on 02/19/2015 and was restarted on the coumadin following this(though date unclear per daughter, she initially said yesterday, but then later was not certain).    LKW: 14:40 tpa given?: yes, but infusion was then paused.     ROS:   Unable to obtain due to altered mental status.   Past Medical History  Diagnosis Date  . Dysrhythmia     atrial fibrillation  . Hypertension   . IHSS (idiopathic hypertrophic subaortic stenosis)   . Coronary atherosclerosis of native coronary artery   . Benign neoplasm of colon   . Diabetes mellitus without complication   . Arthritis   . Nephrolithiasis   . Gout   . Sleep apnea   . Shingles   . History of gastritis   . Cardiomyopathy, secondary     Family History: Unable to assess secondary to patient's altered mental status.    Social History: Tob: Unable to assess secondary to patient's altered mental status.    Exam: Current vital signs: BP 108/31 mmHg  Pulse 72  Temp(Src) 98.4 F (36.9 C) (Oral)  Resp 13  Wt 100 kg (220 lb 7.4 oz)  SpO2 95% Vital signs in last 24 hours: Temp:  [98.4 F (36.9 C)] 98.4 F (36.9 C) (07/16 1615) Pulse Rate:  [72-96] 72 (07/16 1620) Resp:  [13-28] 13 (07/16 1620) BP: (108-133)/(31-93) 108/31 mmHg (07/16 1630) SpO2:  [90 %-96 %] 95 % (07/16 1620) Weight:  [100 kg (220 lb 7.4 oz)] 100 kg (220 lb 7.4 oz) (07/16 1600)   Physical Exam  Constitutional: Appears well-developed and well-nourished.  Psych: Appears anxious Eyes: No scleral injection HENT: No OP obstrucion Head: Normocephalic.  Cardiovascular: Normal rate and regular rhythm.  Respiratory: Effort  normal and breath sounds normal to anterior ascultation GI: Soft.  No distension. There is no tenderness.  Skin: WDI  Neuro: Mental Status: Patient is lethargic, but in addition appears he has eyelid apraxia(keeps eyelids closed despite being awake) He is able to follow commands, and answer simple questions. Has compulsive scratching of his right hip.  Cranial Nerves: II: L hemianopia. Pupils are equal, round, and reactive to light.   III,IV, VI: R gaze deviation  V: Facial sensation is decreased on left VII: Facial movement is left facial weakness VIII: hearing is intact to voice X, XI, XII: Unable to assess secondary to patient's altered mental status.  Motor: Tone is normal. Bulk is normal. 5/5 strength was present on the right, no movement to command on the left, but does have some flexion to noxious stimuli.  Sensory: Complete loss on the left.  Cerebellar: No clear ataxia on right, though does not perform FNF, unable to move left.    I have reviewed labs in epic and the results pertinent to this consultation are: cmp - unremarkable.    I have reviewed the images obtained: CT head - no ischemia, I suspect dense r mca  Impression: 79 yo M with s/s of large R MCA infarct. His PT/INR was borderline, and initially I decided to treat based on his INR, however after further reviewing his PT, at 10  I did decide to stop tPA,  in the setting of a worsening exam. Repeat head CT, however did not show any hemorrhage.   Recommendations: 1. To IR for possible thrombectomy.  2. MRI of the brain without contrast 3. Frequent neuro checks 4. Echocardiogram 5. Carotid dopplers 6. Prophylactic therapy-holding for now given partial tPA dose.  7. Risk factor modification 8. Telemetry monitoring 9. PT consult, OT consult, Speech consult 10. HgbA1c, fasting lipid panel   Roland Rack, MD [facility] [phone]  If 7pm- 7am, please page neurology on call as listed in  [place].   tPA was stopped in the setting of clinical worsening, but after CT showed no hemnorrhage, given that there was a contraindication to tpa(high PT) and there would be little delay in getting him to intervention, the tpa was not restarted.   Roland Rack, MD [facility] [phone]  If 7pm- 7am, please page neurology on call as listed in [place].

## 2015-02-28 NOTE — Anesthesia Postprocedure Evaluation (Signed)
  Anesthesia Post-op Note  Patient: Evan Oliver  Procedure(s) Performed: Procedure(s): RADIOLOGY WITH ANESTHESIA (N/A)  Patient Location: SICU  Anesthesia Type:General  Level of Consciousness: sedated and Patient remains intubated per anesthesia plan  Airway and Oxygen Therapy: Patient remains intubated per anesthesia plan and Patient placed on Ventilator (see vital sign flow sheet for setting)  Post-op Pain: none  Post-op Assessment: Post-op Vital signs reviewed, Patient's Cardiovascular Status Stable, Respiratory Function Stable, Patent Airway, No signs of Nausea or vomiting and Pain level controlled              Post-op Vital Signs: stable  Last Vitals:  Filed Vitals:   02/28/15 1910  BP: 166/82  Pulse:   Temp:   Resp:     Complications: No apparent anesthesia complications

## 2015-02-28 NOTE — ED Notes (Signed)
Going for 2nd CT this nurse and rapid response along with.

## 2015-02-28 NOTE — Progress Notes (Signed)
[facility] Progress Note Patient Name: Evan Oliver DOB: 06-16-1936 MRN: [id number]   Date of Service  02/28/2015  HPI/Events of Note  Patient with large ischemic CAV s/p IR intervention. Intubated and ventilated.   eICU Interventions  Ventilator orders written. Will check ABG now. Propofol also ordered for sedation.     Intervention Category Major Interventions: Respiratory failure - evaluation and management  Zaela Graley [place] 02/28/2015, 8:31 PM

## 2015-02-28 NOTE — ED Provider Notes (Signed)
CSN: [id number]     Arrival date & time 02/28/15  1543 History   First MD Initiated Contact with Patient 02/28/15 1545     Chief Complaint  Patient presents with  . Code Stroke    (Consider location/radiation/quality/duration/timing/severity/associated sxs/prior Treatment) Patient is a 79 y.o. male presenting with Acute Neurological Problem. The history is provided by the EMS personnel and a relative. The history is limited by the condition of the patient.  Cerebrovascular Accident This is a new problem. The current episode started today. The problem has been unchanged. Associated symptoms include weakness. Associated symptoms comments: Speech deficits. Nothing aggravates the symptoms. He has tried nothing for the symptoms.    Past Medical History  Diagnosis Date  . Dysrhythmia     atrial fibrillation  . Hypertension   . IHSS (idiopathic hypertrophic subaortic stenosis)   . Coronary atherosclerosis of native coronary artery   . Benign neoplasm of colon   . Diabetes mellitus without complication   . Arthritis   . Nephrolithiasis   . Gout   . Sleep apnea   . Shingles   . History of gastritis   . Cardiomyopathy, secondary    Past Surgical History  Procedure Laterality Date  . Appendectomy    . Rotator cuff repair Right   . Abdominal wall cyst x2    . Cardiac catheterization    . Cardiac defibrillator placement    . Colonoscopy    . Pacemaker insertion N/A 02/19/2015    Procedure: INSERTION PACEMAKER/  CHANGE OUT;  Surgeon: Isaias Cowman, MD;  Location: [facility] ORS;  Service: Cardiovascular;  Laterality: N/A;   No family history on file. History  Substance Use Topics  . Smoking status: Never Smoker   . Smokeless tobacco: Never Used  . Alcohol Use: No    Review of Systems  Unable to perform ROS: Acuity of condition  Neurological: Positive for syncope, facial asymmetry, speech difficulty and weakness.  Psychiatric/Behavioral: Positive for confusion.      Allergies   Review of patient's allergies indicates no known allergies.  Home Medications   Prior to Admission medications   Medication Sig Start Date End Date Taking? Authorizing Provider  atorvastatin (LIPITOR) 40 MG tablet Take 40 mg by mouth daily.   Yes Historical Provider, MD  fluticasone (FLONASE) 50 MCG/ACT nasal spray Place 2 sprays into the nose. 10/09/14  Yes Historical Provider, MD  indomethacin (INDOCIN) 25 MG capsule Take 25 mg by mouth daily as needed (for gout).  12/15/14  Yes Historical Provider, MD  metFORMIN (GLUCOPHAGE-XR) 500 MG 24 hr tablet Take 1,000 mg by mouth 2 (two) times daily. 10/31/14  Yes Historical Provider, MD  Ascorbic Acid (VITAMIN C) 1000 MG tablet Take 1,000 mg by mouth daily.    Historical Provider, MD  Ascorbic Acid (VITAMIN C) 1000 MG tablet Take 1,000 mg by mouth daily.    Historical Provider, MD  aspirin EC 81 MG tablet Take 81 mg by mouth daily.    Historical Provider, MD  cephALEXin (KEFLEX) 250 MG capsule Take 1 capsule (250 mg total) by mouth 4 (four) times daily. 02/19/15   Isaias Cowman, MD  Cyanocobalamin (RA VITAMIN B-12 TR) 1000 MCG TBCR Take 1,000 mcg by mouth daily.    Historical Provider, MD  cyanocobalamin 1000 MCG tablet Take 100 mcg by mouth daily.    Historical Provider, MD  DHA-EPA-VITAMIN E PO Take 1,000 mg by mouth daily.    Historical Provider, MD  digoxin (LANOXIN) 0.125 MG tablet Take 0.125  mg by mouth daily.    Historical Provider, MD  glimepiride (AMARYL) 1 MG tablet Take 1 mg by mouth daily. 02/18/15   Historical Provider, MD  Glucosamine-Chondroit-Vit C-Mn (GLUCOSAMINE CHONDR 1500 COMPLX) CAPS Take by mouth.    Historical Provider, MD  Iron, Ferrous Gluconate, 256 (28 FE) MG TABS Take by mouth.    Historical Provider, MD  loratadine (CLARITIN) 10 MG tablet Take 10 mg by mouth daily as needed for allergies.    Historical Provider, MD  metFORMIN (GLUCOPHAGE) 500 MG tablet Take 1,000 mg by mouth 2 (two) times daily with a meal.    Historical  Provider, MD  metoprolol (LOPRESSOR) 50 MG tablet Take 50 mg by mouth 2 (two) times daily.    Historical Provider, MD  Multiple Vitamin (MULTI-VITAMINS) TABS Take 1 tablet by mouth daily.    Historical Provider, MD  Omega-3 Fatty Acids (FISH OIL) 1000 MG CAPS Take 1 capsule by mouth daily.    Historical Provider, MD  oxyCODONE-acetaminophen (PERCOCET/ROXICET) 5-325 MG per tablet Take 1 tablet by mouth every 6 (six) hours as needed for severe pain.    Historical Provider, MD  Potassium 99 MG TABS Take 99 mg by mouth daily.    Historical Provider, MD  warfarin (COUMADIN) 1 MG tablet Take 0.5 mg by mouth daily.    Historical Provider, MD  warfarin (COUMADIN) 3 MG tablet Take 3 mg by mouth daily.    Historical Provider, MD   Temp(Src) 98.4 F (36.9 C) (Oral)  Wt 220 lb 7.4 oz (100 kg) Physical Exam  Constitutional: He appears well-developed and well-nourished. No distress.  HENT:  Head: Atraumatic.  Nose: Nose normal.  Mouth/Throat: Oropharynx is clear and moist. No oropharyngeal exudate.  Left sided facial droop  Eyes:  Rightward gaze preference.  Left sided neglect  Neck: Normal range of motion. Neck supple.  Cardiovascular: Normal rate, regular rhythm, normal heart sounds and intact distal pulses.   No murmur heard. Pulmonary/Chest: Effort normal and breath sounds normal. No respiratory distress. He has no wheezes. He exhibits no tenderness.  Abdominal: Soft. He exhibits no distension. There is no tenderness. There is no guarding.  Musculoskeletal: Normal range of motion. He exhibits no tenderness.  Neurological:  Lethargic.  Rightward gaze deviation when eyelids are manually opened (patient holding eyelids closed).  Left facial weakness and upper/lower extremity flacidity.  Left sided neglect with no sensation.    Repetitive right hand rubbing noted.  Speech is significantly limited and dysarthric  Skin: Skin is warm and dry. He is not diaphoretic. No pallor.  Psychiatric: He has a  normal mood and affect. His behavior is normal. Judgment and thought content normal.  Nursing note and vitals reviewed.   ED Course  Procedures (including critical care time) Labs Review Labs Reviewed  PROTIME-INR - Abnormal; Notable for the following:    Prothrombin Time 18.6 (*)    INR 1.54 (*)    All other components within normal limits  CBC - Abnormal; Notable for the following:    RBC 3.74 (*)    HCT 37.0 (*)    MCH 34.8 (*)    Platelets 142 (*)    All other components within normal limits  I-STAT TROPOININ, ED - Abnormal; Notable for the following:    Troponin i, poc 0.09 (*)    All other components within normal limits  I-STAT CHEM 8, ED - Abnormal; Notable for the following:    Glucose, Bld 104 (*)    All other components  within normal limits  APTT  DIFFERENTIAL  COMPREHENSIVE METABOLIC PANEL  CBG MONITORING, ED    Imaging Review Ct Head Wo Contrast  02/28/2015   CLINICAL DATA:  Code stroke.  Left-sided weakness.  EXAM: CT HEAD WITHOUT CONTRAST  TECHNIQUE: Contiguous axial images were obtained from the base of the skull through the vertex without intravenous contrast.  COMPARISON:  None.  FINDINGS: There is prominence of the sulci and ventricles consistent with brain atrophy. Low density structure within the left centrum semiovale noted compatible with chronic lacunar infarct. There is no evidence for acute brain infarct, intracranial hemorrhage or mass. No extra-axial fluid collections identified. The mastoid air cells and paranasal sinuses are clear. The calvarium appears in tact.  IMPRESSION: 1. No acute intracranial abnormalities.   Electronically Signed   By: Kerby Moors M.D.   On: 02/28/2015 16:06     EKG Interpretation   Date/Time:  Saturday February 28 2015 16:05:20 EDT Ventricular Rate:  83 PR Interval:    QRS Duration: 117 QT Interval:  410 QTC Calculation: 482 R Axis:   -123 Text Interpretation:  Atrial fibrillation Ventricular premature complex   Nonspecific intraventricular conduction delay Anteroseptal infarct, old  Repol abnrm suggests ischemia, lateral leads Minimal ST elevation, lateral  leads last ekg showed paced rythm Confirmed by Winfred Leeds  MD, SAM [phone])  on 02/28/2015 4:13:11 PM      MDM   Final diagnoses:  Stroke   Pt is a 79 yo M with hx of Afib, HTN, CAD, DM, and cardiomyopathy who presented as a code stroke.  Has been off coumadin for a few days as he is about to get an AICD replaced this week.  At 1440, he collapsed while walking with daughter.  Since then he has been ignoring his left side, slurred speech, and confused.  Presented as a code stroke around 1540.  O2 sats ok and airway intact, so taken emergently to the [place] [facility] with neurology.  Has rightward gaze deviation when eyelids are manually opened (patient holding eyelids closed).  Left facial weakness and upper/lower extremity flacidity.  Left sided neglect with no sensation.      1610: Dr. Leonel Ramsay with neurology reported he will give TPA, go to emergent angiography, and admit to the neurology ICU unit.  Family at bedside and aware of plan.    Labs, EKGs, and imaging were reviewed and interpreted by myself and my attending, and incorporated in the medical decision making.  Patient was seen with ED Attending, Dr. Gerarda Fraction, MD   Tori Milks, MD 03/01/15 [place], MD 03/01/15 1459

## 2015-02-28 NOTE — ED Notes (Signed)
2 20 gauge IV's placed in pt's right hand, also has an 18 gauge left AC placed by EMS, and another 20 gauge placed by [facility], rapid response in patient's left forearm.

## 2015-02-28 NOTE — Procedures (Signed)
S/P Rt common carotid arteriogram RT CFA approach. Findings. 1.RT MCA  M1 occlusion  With TICI 2b /3a revascularization of M1 occlusion using 10 mg of IA superselective TPA and  X 1 pass with 4 x 40 Solitaire FR retrieval device. 2.Distal RT A 2 clot unchanged with partial flow distal to it

## 2015-02-28 NOTE — Code Documentation (Signed)
Code Stroke called at 1520 patient arrived via EMS at 1543.  EDP and neurologist assess pt upon arrival.  Stat head CT and labs done.  Patient last known well at 1440 today.  Upon arrival he was able to speak and was following commands.  During CT he only spoke a couple of words but was still following commands.  Initial NIHSS 24 while he was speaking.  Shortly after arrival to the room he was mute and not following commands as well.  TPA started at 1614 then stopped at 1622 because of additional lab results.  2nd Stat head CT done because of worsening neuro status, patient less responsive.  Then patient patient transported directly to IR.  DR Estanislado Pandy spoke with daughters regarding IR procedures, consent obtained.  Dr Leonel Ramsay at bedside and 2 daughters at bedside.  Plan admit to neuro ICU post IR.

## 2015-02-28 NOTE — ED Provider Notes (Signed)
Seen on arrival Level V caveat, urgency of situation, code stroke. History is obtained from EMS and from patient's daughter who accompanies him patient was walking in his yard and last normal at 2:40 PM when he collapsed.. Patient was found to have left-sided paralysis by EMS with rightward gaze. Code stroke was called in the field. Patient's Coumadin was stopped temporarily recently in order to have an AICD implanted. On exam patient is ill-appearing HEENT exam no facial asymmetry neck supple neurologic speech is slurred, Glasgow Coma Score 11; 3 off for eye opening one off for speech moves right upper extremity and right lower extremity on command. Paralysis of left upper and left lower extremities.   4:05 PM exam unchanged  Patient evaluated by neurology service Dr.Kirkpatrick, he will be administered intravenous thrombolytic And admitted to neurologic intensive care unit Results for orders placed or performed during the hospital encounter of 02/28/15  Protime-INR  Result Value Ref Range   Prothrombin Time 18.6 (H) 11.6 - 15.2 seconds   INR 1.54 (H) 0.00 - 1.49  APTT  Result Value Ref Range   aPTT 33 24 - 37 seconds  CBC  Result Value Ref Range   WBC 5.6 4.0 - 10.5 K/uL   RBC 3.74 (L) 4.22 - 5.81 MIL/uL   Hemoglobin 13.0 13.0 - 17.0 g/dL   HCT 37.0 (L) 39.0 - 52.0 %   MCV 98.9 78.0 - 100.0 fL   MCH 34.8 (H) 26.0 - 34.0 pg   MCHC 35.1 30.0 - 36.0 g/dL   RDW 12.8 11.5 - 15.5 %   Platelets 142 (L) 150 - 400 K/uL  Differential  Result Value Ref Range   Neutrophils Relative % 43 43 - 77 %   Neutro Abs 2.4 1.7 - 7.7 K/uL   Lymphocytes Relative 42 12 - 46 %   Lymphs Abs 2.3 0.7 - 4.0 K/uL   Monocytes Relative 12 3 - 12 %   Monocytes Absolute 0.7 0.1 - 1.0 K/uL   Eosinophils Relative 3 0 - 5 %   Eosinophils Absolute 0.2 0.0 - 0.7 K/uL   Basophils Relative 0 0 - 1 %   Basophils Absolute 0.0 0.0 - 0.1 K/uL  I-stat troponin, ED (not at [facility], [facility])  Result Value Ref Range   Troponin i,  poc 0.09 (HH) 0.00 - 0.08 ng/mL   Comment NOTIFIED PHYSICIAN    Comment 3          CBG monitoring, ED  Result Value Ref Range   Glucose-Capillary 97 65 - 99 mg/dL  I-Stat Chem 8, ED  (not at [facility], [facility])  Result Value Ref Range   Sodium 142 135 - 145 mmol/L   Potassium 4.1 3.5 - 5.1 mmol/L   Chloride 105 101 - 111 mmol/L   BUN 17 6 - 20 mg/dL   Creatinine, Ser 0.70 0.61 - 1.24 mg/dL   Glucose, Bld 104 (H) 65 - 99 mg/dL   Calcium, Ion 1.17 1.13 - 1.30 mmol/L   TCO2 23 0 - 100 mmol/L   Hemoglobin 13.6 13.0 - 17.0 g/dL   HCT 40.0 39.0 - 52.0 %   Dg Chest 2 View  02/10/2015   CLINICAL DATA:  Preoperative examination for defibrillator change out ; history of hypertension and cardiac dysrhythmia and coronary artery disease.  EXAM: CHEST  2 VIEW  COMPARISON:  None.  FINDINGS: The lungs are adequately inflated. The interstitial markings are mildly increased diffusely. The heart is top-normal in size. The pulmonary vascularity is normal.  There is marked tortuosity of the descending thoracic aorta. The permanent pacemaker defibrillator is in reasonable position radiographically. The mediastinum is normal in width. There is no pleural effusion.  IMPRESSION: There is no active cardiopulmonary disease. There is chronic mild increase in the pulmonary interstitial markings.   Electronically Signed   By: David  Martinique M.D.   On: 02/10/2015 14:34   Ct Head Wo Contrast  02/28/2015   CLINICAL DATA:  Code stroke.  Left-sided weakness.  EXAM: CT HEAD WITHOUT CONTRAST  TECHNIQUE: Contiguous axial images were obtained from the base of the skull through the vertex without intravenous contrast.  COMPARISON:  None.  FINDINGS: There is prominence of the sulci and ventricles consistent with brain atrophy. Low density structure within the left centrum semiovale noted compatible with chronic lacunar infarct. There is no evidence for acute brain infarct, intracranial hemorrhage or mass. No extra-axial fluid collections  identified. The mastoid air cells and paranasal sinuses are clear. The calvarium appears in tact.  IMPRESSION: 1. No acute intracranial abnormalities.   Electronically Signed   By: Kerby Moors M.D.   On: 02/28/2015 16:06   CRITICAL CARE Performed by: Orlie Dakin Total critical care time: 30 minute Critical care time was exclusive of separately billable procedures and treating other patients. Critical care was necessary to treat or prevent imminent or life-threatening deterioration. Critical care was time spent personally by me on the following activities: development of treatment plan with patient and/or surrogate as well as nursing, discussions with consultants, evaluation of patient's response to treatment, examination of patient, obtaining history from patient or surrogate, ordering and performing treatments and interventions, ordering and review of laboratory studies, ordering and review of radiographic studies, pulse oximetry and re-evaluation of patient's condition.  Orlie Dakin, MD 02/28/15 [phone]

## 2015-02-28 NOTE — Consult Note (Signed)
PULMONARY / CRITICAL CARE MEDICINE   Name: Evan Oliver MRN: [id number] DOB: 08-23-1935    ADMISSION DATE:  02/28/2015 CONSULTATION DATE:  02/28/2015  REFERRING MD :  Dr. Leonel Ramsay  CHIEF COMPLAINT:  Stroke  INITIAL PRESENTATION:  79 yo male presented with collapse and Lt sided weakness from CVA.  Had neuro IR procedure, and intubated for airway protection.  Has hx of A fib >> coumadin held for ICD exchange 02/19/15.  STUDIES:  7/16 CT head >> no acute findings  SIGNIFICANT EVENTS: 7/16 Admit, neuro IR procedure, VDRF   HISTORY OF PRESENT ILLNESS:   Hx from medical record and d/w neurology.  79 yo male with hx of complete heart block had ICD changed on 02/19/15 at [facility] with [facility] cardiology.  He has hx of a fib, and has his anticoagulation held for procedure.  He was waking across field, and then collapsed.  He was noted to have Lt sided weakness.  He was brought to the ER.  He was found to have Rt MCA infarct.  He was seen by neurology.  He was taken by neuro IR for thrombectomy.  He was intubated for airway protection.  PAST MEDICAL HISTORY :   has a past medical history of Dysrhythmia; Hypertension; IHSS (idiopathic hypertrophic subaortic stenosis); Coronary atherosclerosis of native coronary artery; Benign neoplasm of colon; Diabetes mellitus without complication; Arthritis; Nephrolithiasis; Gout; Sleep apnea; Shingles; History of gastritis; and Cardiomyopathy, secondary.  has past surgical history that includes Appendectomy; Rotator cuff repair (Right); abdominal wall cyst x2; Cardiac catheterization; Cardiac defibrillator placement; Colonoscopy; and Pacemaker insertion (N/A, 02/19/2015). Prior to Admission medications   Medication Sig Start Date End Date Taking? Authorizing Provider  atorvastatin (LIPITOR) 40 MG tablet Take 40 mg by mouth daily.   Yes Historical Provider, MD  fluticasone (FLONASE) 50 MCG/ACT nasal spray Place 2 sprays into the nose. 10/09/14  Yes Historical  Provider, MD  indomethacin (INDOCIN) 25 MG capsule Take 25 mg by mouth daily as needed (for gout).  12/15/14  Yes Historical Provider, MD  metFORMIN (GLUCOPHAGE-XR) 500 MG 24 hr tablet Take 1,000 mg by mouth 2 (two) times daily. 10/31/14  Yes Historical Provider, MD  Ascorbic Acid (VITAMIN C) 1000 MG tablet Take 1,000 mg by mouth daily.    Historical Provider, MD  Ascorbic Acid (VITAMIN C) 1000 MG tablet Take 1,000 mg by mouth daily.    Historical Provider, MD  aspirin EC 81 MG tablet Take 81 mg by mouth daily.    Historical Provider, MD  cephALEXin (KEFLEX) 250 MG capsule Take 1 capsule (250 mg total) by mouth 4 (four) times daily. 02/19/15   Isaias Cowman, MD  Cyanocobalamin (RA VITAMIN B-12 TR) 1000 MCG TBCR Take 1,000 mcg by mouth daily.    Historical Provider, MD  cyanocobalamin 1000 MCG tablet Take 100 mcg by mouth daily.    Historical Provider, MD  DHA-EPA-VITAMIN E PO Take 1,000 mg by mouth daily.    Historical Provider, MD  digoxin (LANOXIN) 0.125 MG tablet Take 0.125 mg by mouth daily.    Historical Provider, MD  glimepiride (AMARYL) 1 MG tablet Take 1 mg by mouth daily. 02/18/15   Historical Provider, MD  Glucosamine-Chondroit-Vit C-Mn (GLUCOSAMINE CHONDR 1500 COMPLX) CAPS Take by mouth.    Historical Provider, MD  Iron, Ferrous Gluconate, 256 (28 FE) MG TABS Take by mouth.    Historical Provider, MD  loratadine (CLARITIN) 10 MG tablet Take 10 mg by mouth daily as needed for allergies.    Historical  Provider, MD  metFORMIN (GLUCOPHAGE) 500 MG tablet Take 1,000 mg by mouth 2 (two) times daily with a meal.    Historical Provider, MD  metoprolol (LOPRESSOR) 50 MG tablet Take 50 mg by mouth 2 (two) times daily.    Historical Provider, MD  Multiple Vitamin (MULTI-VITAMINS) TABS Take 1 tablet by mouth daily.    Historical Provider, MD  Omega-3 Fatty Acids (FISH OIL) 1000 MG CAPS Take 1 capsule by mouth daily.    Historical Provider, MD  oxyCODONE-acetaminophen (PERCOCET/ROXICET) 5-325 MG per  tablet Take 1 tablet by mouth every 6 (six) hours as needed for severe pain.    Historical Provider, MD  Potassium 99 MG TABS Take 99 mg by mouth daily.    Historical Provider, MD  warfarin (COUMADIN) 1 MG tablet Take 0.5 mg by mouth daily.    Historical Provider, MD  warfarin (COUMADIN) 3 MG tablet Take 3 mg by mouth daily.    Historical Provider, MD   No Known Allergies  FAMILY HISTORY:  has no family status information on file.  SOCIAL HISTORY:  reports that he has never smoked. He has never used smokeless tobacco. He reports that he does not drink alcohol or use illicit drugs.  REVIEW OF SYSTEMS:  Unable to obtain  SUBJECTIVE:   VITAL SIGNS: Temp:  [98.4 F (36.9 C)] 98.4 F (36.9 C) (07/16 1615) Pulse Rate:  [72-96] 72 (07/16 1620) Resp:  [13-28] 13 (07/16 1620) BP: (108-133)/(31-93) 108/31 mmHg (07/16 1630) SpO2:  [90 %-96 %] 95 % (07/16 1620) Weight:  [220 lb 7.4 oz (100 kg)] 220 lb 7.4 oz (100 kg) (07/16 1600) HEMODYNAMICS:   VENTILATOR SETTINGS:   INTAKE / OUTPUT:  Intake/Output Summary (Last 24 hours) at 02/28/15 1823 Last data filed at 02/28/15 1726  Gross per 24 hour  Intake      0 ml  Output    100 ml  Net   -100 ml    PHYSICAL EXAMINATION: General: sedated Neuro:  RASS -3 HEENT:  ETT in place Cardiovascular:  irregular Lungs:  No wheeze Abdomen:  Soft, non tender Musculoskeletal:  No edema Skin:  No rashes  LABS:  CBC  Recent Labs Lab 02/28/15 1545 02/28/15 1552  WBC 5.6  --   HGB 13.0 13.6  HCT 37.0* 40.0  PLT 142*  --    Coag's  Recent Labs Lab 02/28/15 1545  APTT 33  INR 1.54*   BMET  Recent Labs Lab 02/28/15 1545 02/28/15 1552  NA 141 142  K 4.2 4.1  CL 108 105  CO2 23  --   BUN 14 17  CREATININE 0.76 0.70  GLUCOSE 106* 104*   Electrolytes  Recent Labs Lab 02/28/15 1545  CALCIUM 9.3   Sepsis Markers No results for input(s): LATICACIDVEN, PROCALCITON, O2SATVEN in the last 168 hours.   ABG No results for  input(s): PHART, PCO2ART, PO2ART in the last 168 hours.   Liver Enzymes  Recent Labs Lab 02/28/15 1545  AST 24  ALT 21  ALKPHOS 59  BILITOT 1.0  ALBUMIN 3.4*   Cardiac Enzymes No results for input(s): TROPONINI, PROBNP in the last 168 hours.   Glucose  Recent Labs Lab 02/28/15 1544  GLUCAP 97    Imaging Ct Head Wo Contrast  02/28/2015   CLINICAL DATA:  79 year old male code stroke status post tPA. Left side symptoms are worsening. Initial encounter.  EXAM: CT HEAD WITHOUT CONTRAST  TECHNIQUE: Contiguous axial images were obtained from the base of the skull  through the vertex without intravenous contrast.  COMPARISON:  Noncontrast head CT at 1552 hours today.  FINDINGS: No acute intracranial hemorrhage identified. No midline shift, mass effect, or evidence of intracranial mass lesion. Stable gray-white matter differentiation in both hemispheres. No evidence of cortically based acute infarction identified.  Prominent calcified plaque in the right supraclinoid ICA Re identified in addition to other calcified skullbase atherosclerosis. No asymmetric intracranial vascular hyperdensity identified.  No ventriculomegaly. Small area of chronic encephalomalacia suspected in the left occipital lobe (series 2, image 11) and appears chin stable. Posterior fossa gray-white matter differentiation within normal limits.  Stable paranasal sinuses and mastoids. No acute osseous abnormality identified. Orbit and scalp soft tissues are within normal limits.  IMPRESSION: 1. Stable noncontrast CT appearance of the brain since 1552 hours. ASPECTS score remains [place] Stroke Program Early CT Score Normal score = 10 2. Calcified atherosclerosis at the skullbase, specially the supraclinoid right ICA. Small chronic appearing focus of cortical encephalomalacia in the left occipital pole. Study reviewed in person with Dr. Roland Rack at 1638 hours on 02/28/2015.   Electronically Signed   By: Genevie Ann M.D.    On: 02/28/2015 16:48   Ct Head Wo Contrast  02/28/2015   CLINICAL DATA:  Code stroke.  Left-sided weakness.  EXAM: CT HEAD WITHOUT CONTRAST  TECHNIQUE: Contiguous axial images were obtained from the base of the skull through the vertex without intravenous contrast.  COMPARISON:  None.  FINDINGS: There is prominence of the sulci and ventricles consistent with brain atrophy. Low density structure within the left centrum semiovale noted compatible with chronic lacunar infarct. There is no evidence for acute brain infarct, intracranial hemorrhage or mass. No extra-axial fluid collections identified. The mastoid air cells and paranasal sinuses are clear. The calvarium appears in tact.  IMPRESSION: 1. No acute intracranial abnormalities.   Electronically Signed   By: Kerby Moors M.D.   On: 02/28/2015 16:06     ASSESSMENT / PLAN:  PULMONARY ETT 7/16 >>   A: Compromised airway in setting of CVA. Hx of OSA. P:   Full vent support until neuro status stable F/u CXR, ABG BDs prn  CARDIOVASCULAR A:  Hx of HTN, A fib, complete heart block, s/p ICD, CAD, cardiomyopathy. P:  Monitor hemodynamics Lipitor, digoxin, lopressor Hold coumadin Add heparin gtt when okay with neurology/neuro-IR Cardene gtt per neuro IR F/u Echo  RENAL A:   No acute issues. P:   Monitor renal fx, urine outpt, electrolytes  GASTROINTESTINAL A:  Nutrition. P:   Tube feeds if unable to extubate soon Protonix for SUP  HEMATOLOGIC A:   Mild thrombocytopenia. P:  F/u CBC SCD's  INFECTIOUS A:   No evidence for infection. P:   Monitor clinically  ENDOCRINE A:   DM type II.   P:   SSI Hold amaryl, glucophage  NEUROLOGIC A:   Acute Rt MCA CVA s/p neuro IR procedure. P:   RASS goal: -1 Per neurology, and neuro-IR F/u MRI brain  D/w Dr. Leonel Ramsay  Updated pt's daughter at bedside.  CC time 35 minutes.  Chesley Mires, MD [facility] Pulmonary/Critical Care 02/28/2015, 8:40 PM Pager:   [phone] After 3pm call: [phone]

## 2015-03-01 ENCOUNTER — Inpatient Hospital Stay (HOSPITAL_COMMUNITY): Payer: Medicare Other

## 2015-03-01 DIAGNOSIS — G936 Cerebral edema: Secondary | ICD-10-CM | POA: Insufficient documentation

## 2015-03-01 LAB — CBC WITH DIFFERENTIAL/PLATELET
BASOS PCT: 0 % (ref 0–1)
Basophils Absolute: 0 10*3/uL (ref 0.0–0.1)
Eosinophils Absolute: 0 10*3/uL (ref 0.0–0.7)
Eosinophils Relative: 0 % (ref 0–5)
HEMATOCRIT: 34.5 % — AB (ref 39.0–52.0)
Hemoglobin: 11.9 g/dL — ABNORMAL LOW (ref 13.0–17.0)
Lymphocytes Relative: 16 % (ref 12–46)
Lymphs Abs: 1.3 10*3/uL (ref 0.7–4.0)
MCH: 34.2 pg — AB (ref 26.0–34.0)
MCHC: 34.5 g/dL (ref 30.0–36.0)
MCV: 99.1 fL (ref 78.0–100.0)
Monocytes Absolute: 0.8 10*3/uL (ref 0.1–1.0)
Monocytes Relative: 10 % (ref 3–12)
Neutro Abs: 6.1 10*3/uL (ref 1.7–7.7)
Neutrophils Relative %: 74 % (ref 43–77)
PLATELETS: 123 10*3/uL — AB (ref 150–400)
RBC: 3.48 MIL/uL — AB (ref 4.22–5.81)
RDW: 13 % (ref 11.5–15.5)
WBC: 8.3 10*3/uL (ref 4.0–10.5)

## 2015-03-01 LAB — BASIC METABOLIC PANEL
ANION GAP: 5 (ref 5–15)
BUN: 11 mg/dL (ref 6–20)
CHLORIDE: 105 mmol/L (ref 101–111)
CO2: 27 mmol/L (ref 22–32)
CREATININE: 0.56 mg/dL — AB (ref 0.61–1.24)
Calcium: 8.5 mg/dL — ABNORMAL LOW (ref 8.9–10.3)
GFR calc Af Amer: >60 mL/min (ref >60)
GFR calc non Af Amer: >60 mL/min (ref >60)
GLUCOSE: 140 mg/dL — AB (ref 65–99)
POTASSIUM: 3.6 mmol/L (ref 3.5–5.1)
Sodium: 137 mmol/L (ref 135–145)

## 2015-03-01 LAB — MRSA PCR SCREENING: MRSA by PCR: NEGATIVE

## 2015-03-01 LAB — PROTIME-INR
INR: 1.71 — AB (ref 0.00–1.49)
Prothrombin Time: 20 seconds — ABNORMAL HIGH (ref 11.6–15.2)

## 2015-03-01 LAB — LIPID PANEL
CHOLESTEROL: 100 mg/dL (ref 0–200)
HDL: 33 mg/dL — ABNORMAL LOW (ref >40)
LDL Cholesterol: 53 mg/dL (ref 0–99)
Total CHOL/HDL Ratio: 3 RATIO
Triglycerides: 68 mg/dL (ref <150)
VLDL: 14 mg/dL (ref 0–40)

## 2015-03-01 MED ORDER — PANTOPRAZOLE SODIUM 40 MG IV SOLR
40.0000 mg | INTRAVENOUS | Status: DC
  Administered 2015-03-01: 40 mg via INTRAVENOUS

## 2015-03-01 MED ORDER — CETYLPYRIDINIUM CHLORIDE 0.05 % MT LIQD
7.0000 mL | OROMUCOSAL | Status: DC
  Administered 2015-03-01 (×2): 7 mL via OROMUCOSAL

## 2015-03-01 MED ORDER — MORPHINE SULFATE 2 MG/ML IJ SOLN
1.0000 mg | INTRAMUSCULAR | Status: DC | PRN
  Administered 2015-03-01: 1 mg via INTRAVENOUS
  Administered 2015-03-01 (×2): 2 mg via INTRAVENOUS
  Filled 2015-03-01 (×3): qty 1

## 2015-03-01 MED ORDER — GLYCOPYRROLATE 0.2 MG/ML IJ SOLN
0.2000 mg | INTRAMUSCULAR | Status: DC | PRN
  Administered 2015-03-03: 0.2 mg via INTRAVENOUS
  Filled 2015-03-01 (×4): qty 1

## 2015-03-01 MED ORDER — GLYCOPYRROLATE 0.2 MG/ML IJ SOLN
0.2000 mg | INTRAMUSCULAR | Status: DC | PRN
  Administered 2015-03-02: 0.2 mg via SUBCUTANEOUS
  Filled 2015-03-01 (×3): qty 1

## 2015-03-01 MED ORDER — GLYCOPYRROLATE 1 MG PO TABS
1.0000 mg | ORAL_TABLET | ORAL | Status: DC | PRN
  Filled 2015-03-01 (×2): qty 1

## 2015-03-01 MED ORDER — IPRATROPIUM-ALBUTEROL 0.5-2.5 (3) MG/3ML IN SOLN: 3.0000 mL | RESPIRATORY_TRACT | Status: DC | PRN

## 2015-03-01 MED ORDER — PANTOPRAZOLE SODIUM 40 MG PO PACK
40.0000 mg | PACK | ORAL | Status: DC
  Filled 2015-03-01 (×2): qty 20

## 2015-03-01 MED ORDER — MORPHINE SULFATE 25 MG/ML IV SOLN
5.0000 mg/h | INTRAVENOUS | Status: DC
  Administered 2015-03-01: 1 mg/h via INTRAVENOUS
  Administered 2015-03-02 (×2): 12 mg/h via INTRAVENOUS
  Administered 2015-03-03: 1 mg/h via INTRAVENOUS
  Administered 2015-03-03: 5 mg/h via INTRAVENOUS
  Administered 2015-03-03: 1 mg/h via INTRAVENOUS
  Filled 2015-03-01 (×2): qty 10

## 2015-03-01 NOTE — Procedures (Signed)
Extubation Procedure Note  Patient Details:   Name: TRAVELL DESAULNIERS DOB: Aug 17, 1935 MRN: [id number]   Airway Documentation:     Evaluation  O2 sats: currently acceptable Complications: No apparent complications Patient did tolerate procedure well. Bilateral Breath Sounds: Clear Suctioning: Airway No   Pt terminally extubated per MD order  Mindi Slicker 03/01/2015, 8:38 PM

## 2015-03-01 NOTE — Progress Notes (Signed)
SLP Cancellation Note  Patient Details Name: Evan Oliver MRN: [id number] DOB: 1936-04-27   Cancelled treatment:       Reason Eval/Treat Not Completed: Medical issues which prohibited therapy; pt intubated, ST to follow pending extubation and PO readiness   Arvil Chaco MA, [place] Pathologist    Levi Aland 03/01/2015, 1:07 PM

## 2015-03-01 NOTE — Progress Notes (Signed)
PT Cancellation Note  Patient Details Name: Evan Oliver MRN: [id number] DOB: Oct 19, 1935   Cancelled Treatment:    Reason Eval/Treat Not Completed: Medical issues which prohibited therapy   Pt is on Bedrest until 20:08;  Will follow up for PT evaluation tomorrow;   Thank you, Roney Marion, [place] Pager [phone] Office [phone]    Roney Marion [facility] 03/01/2015, 7:55 AM

## 2015-03-01 NOTE — Progress Notes (Signed)
Dr Leonie Man notified of patients 1.71 INR, no orders given to give any medications to lower. Will address INR after afternoon post TPA CT scan with potential of changing pt code status and comfort care. IR tech notified of plan, will continue to monitor.

## 2015-03-01 NOTE — Progress Notes (Signed)
Referring Physician(s): Sethi  Subjective:  CVA TPA/clot retrieval R MCA 7/16 On vent Can follow few commands with Rt hand and foot Rt groin sheath intact  Allergies: Review of patient's allergies indicates no known allergies.  Medications: Prior to Admission medications   Medication Sig Start Date End Date Taking? Authorizing Provider  atorvastatin (LIPITOR) 40 MG tablet Take 40 mg by mouth daily.   Yes Historical Provider, MD  fluticasone (FLONASE) 50 MCG/ACT nasal spray Place 2 sprays into the nose. 10/09/14  Yes Historical Provider, MD  indomethacin (INDOCIN) 25 MG capsule Take 25 mg by mouth daily as needed (for gout).  12/15/14  Yes Historical Provider, MD  metFORMIN (GLUCOPHAGE-XR) 500 MG 24 hr tablet Take 1,000 mg by mouth 2 (two) times daily. 10/31/14  Yes Historical Provider, MD  Ascorbic Acid (VITAMIN C) 1000 MG tablet Take 1,000 mg by mouth daily.    Historical Provider, MD  Ascorbic Acid (VITAMIN C) 1000 MG tablet Take 1,000 mg by mouth daily.    Historical Provider, MD  aspirin EC 81 MG tablet Take 81 mg by mouth daily.    Historical Provider, MD  cephALEXin (KEFLEX) 250 MG capsule Take 1 capsule (250 mg total) by mouth 4 (four) times daily. 02/19/15   Isaias Cowman, MD  Cyanocobalamin (RA VITAMIN B-12 TR) 1000 MCG TBCR Take 1,000 mcg by mouth daily.    Historical Provider, MD  cyanocobalamin 1000 MCG tablet Take 100 mcg by mouth daily.    Historical Provider, MD  DHA-EPA-VITAMIN E PO Take 1,000 mg by mouth daily.    Historical Provider, MD  digoxin (LANOXIN) 0.125 MG tablet Take 0.125 mg by mouth daily.    Historical Provider, MD  glimepiride (AMARYL) 1 MG tablet Take 1 mg by mouth daily. 02/18/15   Historical Provider, MD  Glucosamine-Chondroit-Vit C-Mn (GLUCOSAMINE CHONDR 1500 COMPLX) CAPS Take by mouth.    Historical Provider, MD  Iron, Ferrous Gluconate, 256 (28 FE) MG TABS Take by mouth.    Historical Provider, MD  loratadine (CLARITIN) 10 MG tablet Take 10 mg  by mouth daily as needed for allergies.    Historical Provider, MD  metFORMIN (GLUCOPHAGE) 500 MG tablet Take 1,000 mg by mouth 2 (two) times daily with a meal.    Historical Provider, MD  metoprolol (LOPRESSOR) 50 MG tablet Take 50 mg by mouth 2 (two) times daily.    Historical Provider, MD  Multiple Vitamin (MULTI-VITAMINS) TABS Take 1 tablet by mouth daily.    Historical Provider, MD  Omega-3 Fatty Acids (FISH OIL) 1000 MG CAPS Take 1 capsule by mouth daily.    Historical Provider, MD  oxyCODONE-acetaminophen (PERCOCET/ROXICET) 5-325 MG per tablet Take 1 tablet by mouth every 6 (six) hours as needed for severe pain.    Historical Provider, MD  Potassium 99 MG TABS Take 99 mg by mouth daily.    Historical Provider, MD  warfarin (COUMADIN) 1 MG tablet Take 0.5 mg by mouth daily.    Historical Provider, MD  warfarin (COUMADIN) 3 MG tablet Take 3 mg by mouth daily.    Historical Provider, MD     Vital Signs: BP 105/58 mmHg  Pulse 75  Temp(Src) 98.5 F (36.9 C) (Oral)  Resp 14  Ht 6\' 1"  (1.854 m)  Wt 212 lb 15.4 oz (96.6 kg)  BMI 28.10 kg/m2  SpO2 97%  Physical Exam  Abdominal:  Rt groin sheath intact No bleeding No hematoma Soft Clean and dry Rt hand and foot both move to  command Grip good No movement on left Rt foot 2+ pulses  Nursing note and vitals reviewed.   Imaging: Ct Head Wo Contrast  02/28/2015   CLINICAL DATA:  79 year old male code stroke status post tPA. Left side symptoms are worsening. Initial encounter.  EXAM: CT HEAD WITHOUT CONTRAST  TECHNIQUE: Contiguous axial images were obtained from the base of the skull through the vertex without intravenous contrast.  COMPARISON:  Noncontrast head CT at 1552 hours today.  FINDINGS: No acute intracranial hemorrhage identified. No midline shift, mass effect, or evidence of intracranial mass lesion. Stable gray-white matter differentiation in both hemispheres. No evidence of cortically based acute infarction identified.   Prominent calcified plaque in the right supraclinoid ICA Re identified in addition to other calcified skullbase atherosclerosis. No asymmetric intracranial vascular hyperdensity identified.  No ventriculomegaly. Small area of chronic encephalomalacia suspected in the left occipital lobe (series 2, image 11) and appears chin stable. Posterior fossa gray-white matter differentiation within normal limits.  Stable paranasal sinuses and mastoids. No acute osseous abnormality identified. Orbit and scalp soft tissues are within normal limits.  IMPRESSION: 1. Stable noncontrast CT appearance of the brain since 1552 hours. ASPECTS score remains [place] Stroke Program Early CT Score Normal score = 10 2. Calcified atherosclerosis at the skullbase, specially the supraclinoid right ICA. Small chronic appearing focus of cortical encephalomalacia in the left occipital pole. Study reviewed in person with Dr. Roland Rack at 1638 hours on 02/28/2015.   Electronically Signed   By: Genevie Ann M.D.   On: 02/28/2015 16:48   Ct Head Wo Contrast  02/28/2015   CLINICAL DATA:  Code stroke.  Left-sided weakness.  EXAM: CT HEAD WITHOUT CONTRAST  TECHNIQUE: Contiguous axial images were obtained from the base of the skull through the vertex without intravenous contrast.  COMPARISON:  None.  FINDINGS: There is prominence of the sulci and ventricles consistent with brain atrophy. Low density structure within the left centrum semiovale noted compatible with chronic lacunar infarct. There is no evidence for acute brain infarct, intracranial hemorrhage or mass. No extra-axial fluid collections identified. The mastoid air cells and paranasal sinuses are clear. The calvarium appears in tact.  IMPRESSION: 1. No acute intracranial abnormalities.   Electronically Signed   By: Kerby Moors M.D.   On: 02/28/2015 16:06   Portable Chest Xray  03/01/2015   CLINICAL DATA:  Acute respiratory failure.  EXAM: PORTABLE CHEST - 1 VIEW  COMPARISON:   02/28/2015  FINDINGS: Endotracheal tube in place 6.3 cm above carina. Left-sided AICD leads to the right atrium, right ventricle. Heart is enlarged and stable. Persistent left base opacity. There has been some interval improvement in aeration of the left lower lobe. Persistent pulmonary vascular congestion.  IMPRESSION: 1. Cardiomegaly and vascular congestion. 2. Slight interval improvement in left lower lobe aeration.   Electronically Signed   By: Nolon Nations M.D.   On: 03/01/2015 07:38   Portable Chest Xray  02/28/2015   CLINICAL DATA:  Acute respiratory failure, recent stroke  EXAM: PORTABLE CHEST - 1 VIEW  COMPARISON:  None.  FINDINGS: Cardiac shadow is mildly enlarged. A defibrillator is noted endotracheal tube is noted in satisfactory position approximately 5.7 cm above the carina. Aortic calcifications are seen. No focal infiltrate is noted.  IMPRESSION: No acute abnormality seen.  Aortic atherosclerotic disease.   Electronically Signed   By: Inez Catalina M.D.   On: 02/28/2015 21:22    Labs:  CBC:  Recent Labs  02/10/15 1127 02/28/15  1545 02/28/15 1552 03/01/15 0230  WBC 6.2 5.6  --  8.3  HGB 13.5 13.0 13.6 11.9*  HCT 39.4* 37.0* 40.0 34.5*  PLT 134* 142*  --  123*    COAGS:  Recent Labs  02/10/15 1127 02/19/15 1123 02/28/15 1545 03/01/15 0853  INR 1.96 1.16 1.54* 1.71*  APTT 37*  --  33  --     BMP:  Recent Labs  02/10/15 1127 02/28/15 1545 02/28/15 1552 03/01/15 0230  NA 141 141 142 137  K 4.0 4.2 4.1 3.6  CL 108 108 105 105  CO2 25 23  --  27  GLUCOSE 120* 106* 104* 140*  BUN 17 14 17 11   CALCIUM 9.6 9.3  --  8.5*  CREATININE 0.63 0.76 0.70 0.56*  GFRNONAA >60 >60  --  >60  GFRAA >60 >60  --  >60    LIVER FUNCTION TESTS:  Recent Labs  02/28/15 1545  BILITOT 1.0  AST 24  ALT 21  ALKPHOS 59  PROT 6.7  ALBUMIN 3.4*    Assessment and Plan:  R MCA CVA Clot retrieval and TPA 7/16 On vent Plan per Dr Leonie Man  INR 1.7 today Will plan for  sheath pull 7/18 am  Signed: Laurence Folz A 03/01/2015, 10:33 AM   I spent a total of 15 Minutes in face to face in clinical consultation/evaluation, greater than 50% of which was counseling/coordinating care for CVA/clot rterieval

## 2015-03-01 NOTE — Progress Notes (Signed)
STROKE TEAM PROGRESS NOTE   HISTORY Evan Oliver is a 79 y.o. male who was walking across a field when he was seen to collapse at 14:40 this afternoon. He was brought in via EMS and was seen to have left sided weakness.   He had his coumadin stopped for several days prior to a procedure on 02/19/2015 and was restarted on the coumadin following this(though date unclear per daughter, she initially said yesterday, but then later was not certain).   LKW: 14:40 tpa given?: yes, but infusion was then paused for neurological worsening but repeat CT scan did not show brain hemorrhage but TPA not restarted by Dr. Addison Lank as ProTime was elevated at 18.     Procedures    CEREBRAL ANGIOGRAM [[id number] (Custom)]       S/P Rt common carotid arteriogram RT CFA approach. Findings. 1.RT MCA M1 occlusion With TICI 2b /3a revascularization of M1 occlusion using 10 mg of IA superselective TPA and X 1 pass with 4 x 40 Solitaire FR retrieval device. 2.Distal RT A 2 clot unchanged with partial flow distal to it              SUBJECTIVE (INTERVAL HISTORY) Patient has been off sedation for last 2 hours but remains unresponsive. Blood pressure is adequately controlled. He remains in a break on the left. His 2 daughters are at the bedside    OBJECTIVE Temp:  [98.4 F (36.9 C)-99.6 F (37.6 C)] 99.6 F (37.6 C) (07/17 0358) Pulse Rate:  [25-106] 43 (07/17 0700) Cardiac Rhythm:  [-]  Resp:  [12-34] 18 (07/17 0700) BP: (101-166)/(31-93) 117/71 mmHg (07/17 0700) SpO2:  [90 %-100 %] 96 % (07/17 0700) Arterial Line BP: (134-168)/(55-75) 162/70 mmHg (07/17 0700) FiO2 (%):  [30 %-40 %] 30 % (07/17 0249) Weight:  [96.6 kg (212 lb 15.4 oz)-100 kg (220 lb 7.4 oz)] 96.6 kg (212 lb 15.4 oz) (07/16 2000)   Recent Labs Lab 02/28/15 1544 02/28/15 1955  GLUCAP 97 141*    Recent Labs Lab 02/28/15 1545 02/28/15 1552 03/01/15 0230  NA 141 142 137  K 4.2 4.1 3.6  CL 108 105 105  CO2 23  --  27   GLUCOSE 106* 104* 140*  BUN 14 17 11   CREATININE 0.76 0.70 0.56*  CALCIUM 9.3  --  8.5*    Recent Labs Lab 02/28/15 1545  AST 24  ALT 21  ALKPHOS 59  BILITOT 1.0  PROT 6.7  ALBUMIN 3.4*    Recent Labs Lab 02/28/15 1545 02/28/15 1552 03/01/15 0230  WBC 5.6  --  8.3  NEUTROABS 2.4  --  6.1  HGB 13.0 13.6 11.9*  HCT 37.0* 40.0 34.5*  MCV 98.9  --  99.1  PLT 142*  --  123*   No results for input(s): CKTOTAL, CKMB, CKMBINDEX, TROPONINI in the last 168 hours.  Recent Labs  02/28/15 1545  LABPROT 18.6*  INR 1.54*   No results for input(s): COLORURINE, LABSPEC, PHURINE, GLUCOSEU, HGBUR, BILIRUBINUR, KETONESUR, PROTEINUR, UROBILINOGEN, NITRITE, LEUKOCYTESUR in the last 72 hours.  Invalid input(s): APPERANCEUR     Component Value Date/Time   CHOL 100 03/01/2015 0230   TRIG 68 03/01/2015 0230   HDL 33* 03/01/2015 0230   CHOLHDL 3.0 03/01/2015 0230   VLDL 14 03/01/2015 0230   LDLCALC 53 03/01/2015 0230   No results found for: HGBA1C No results found for: LABOPIA, COCAINSCRNUR, LABBENZ, AMPHETMU, THCU, LABBARB  No results for input(s): ETH in the last 168 hours.  Imaging    Ct Head Wo Contrast  02/28/2015    1. Stable noncontrast CT appearance of the brain since 1552 hours. ASPECTS score remains [place] Stroke Program Early CT Score Normal score = 10  2. Calcified atherosclerosis at the skullbase, specially the supraclinoid right ICA. Small chronic appearing focus of cortical encephalomalacia in the left occipital pole.     Ct Head Wo Contrast  02/28/2015    1. No acute intracranial abnormalities.      Portable Chest Xray 03/01/2015    1. Cardiomegaly and vascular congestion.  2. Slight interval improvement in left lower lobe aeration.       Portable Chest Xray 02/28/2015    No acute abnormality seen.  Aortic atherosclerotic disease.      PHYSICAL EXAM Elderly Caucasian male who is intubated. He is off sedation for 2 hours. Afebrile. Head is  nontraumatic. Neck is supple without bruit.    Cardiac exam no murmur or gallop. Lungs are clear to auscultation. Distal pulses are well felt. He has groin arterial sheath  Neurological Exam :  Comatose and unresponsive. Eyes closed. Not following any commands. Right gaze deviation with some spontaneous side to side eye moments up to midline. Pupils small 2 mm and sluggishly reactive. Fundi were not visualized. Corneal reflexes are present bilaterally. Cough and gag are present. Tongue is midline. Motor system exam revealed dense left hemiplegia with hypotonia. Some spontaneous right upper and lower extremity moments and brisk withdrawal to pain on the right. Left plantar upgoing right downgoing.    ASSESSMENT/PLAN Mr. Evan Oliver is a 79 y.o. male with history of hypertension, coronary artery disease, IHSS, diabetes mellitus, cardiomyopathy, atrial fibrillation on Coumadin therapy which had recently been held for a defibrillator exchange on 02/19/2015 presenting with collapse and left-sided weakness. He initially received IV TPA; however, this was discontinued secondary to a worsening in the patient's neurological status. The patient absolutely went to interventional radiology for a cerebral angiogram, thrombectomy and intra-arterial TPA.  Stroke:  Non-dominant infarct felt to be embolic secondary to atrial fibrillation.  Resultant   MRI - AICD  MRA  - AICD  Carotid Doppler pending  2D Echo pending  LDL 53  HgbA1c pending  SCDs for VTE prophylaxis  Diet NPO time specified  aspirin 81 mg orally every day and warfarin prior to admission, now on no antithrombotic secondary to TPA therapy  Ongoing aggressive stroke risk factor management  Therapy recommendations: Pending  Disposition: Ending  Hypertension  Home meds: Lopressor  Stable   Hyperlipidemia  Home meds:  Lipitor 40 mg daily resumed in hospital ( NOW NPO )  LDL 53, goal < 70  Continue statin at  discharge  Diabetes  HgbA1c pending, goal < 7.0  Controlled  Other Stroke Risk Factors  Advanced age  Coronary artery disease   Other Active Problems  NPO  Intubated on ventilator  Mild anemia  Other Pertinent History  Hospital day # [place] PA-C [facility] Pager [phone] 03/01/2015, 1:39 PM I have personally examined this patient, reviewed notes, independently viewed imaging studies, participated in medical decision making and plan of care. I have made any additions or clarifications directly to the above note. Agree with note above. He has unfortunately had a large right middle cerebral artery infarct and despite attempts to recanalize with IV TPA as well as intra-arterial TPA and mechanical embolectomy he could only have partial recanalization. His prognosis is guarded and his at significant  risk to develop cytotoxic cerebral edema, brain herniation, neurological worsening and death. I had a long discussion at the bedside with the patient's 2 daughters discussed my clinical exam findings, prognosis, treatment plan and answered questions. Family understood the poor prognosis and clearly stated the patient's wishes did not include prolonged ventilatory support, living in a nursing home and they're likely leaning towards comfort care but would like to call other family members to visit him to further make a final decision tomorrow This patient is critically ill and at significant risk of neurological worsening, death and care requires constant monitoring of vital signs, hemodynamics,respiratory and cardiac monitoring, extensive review of multiple databases, frequent neurological assessment, discussion with family, other specialists and medical decision making of high complexity.I have made any additions or clarifications directly to the above note.This critical care time does not reflect procedure time, or teaching time or supervisory time of PA/NP/Med  Resident etc but could involve care discussion time.  I spent 50 minutes of neurocritical care time  in the care of  this patient.   Antony Contras, MD Medical Director [facility] Pager: [phone] 03/01/2015 6:03 PM     To contact Stroke Continuity provider, please refer to [email or web address] After hours, contact General Neurology

## 2015-03-01 NOTE — Progress Notes (Signed)
I had a long discussion with the family. I discussed the results of his CT scan which showed a massive right MCA stroke as well as some smaller left ACA areas of infarct. There is early significant edema with large midline shift and 24 hours after the stroke, and I strongly suspected there'll be continued swelling with further injury.   I discussed that even on a best case scenario if he were to survive this he would be severely debilitated and nursing home bound. In this setting, they do not feel that he would want continued aggressive care and would favor a change to comfort care. I discussed options including 3% saline as well as hemicraniectomy, but in this setting they feel that he would not want to pursue aggressive care.   I discussed the process including turning off ICD, extubation, etc.   Will make DNR and proceed with comfort care.   Roland Rack, MD [facility] [phone]  If 7pm- 7am, please page neurology on call as listed in [place].

## 2015-03-01 NOTE — Consult Note (Signed)
PULMONARY / CRITICAL CARE MEDICINE   Name: Evan Oliver MRN: [id number] DOB: 1936-01-21    ADMISSION DATE:  02/28/2015 CONSULTATION DATE:  02/28/2015  REFERRING MD :  Dr. Leonel Ramsay  CHIEF COMPLAINT:  Stroke  INITIAL PRESENTATION:  79 yo male presented with collapse and Lt sided weakness from CVA.  Had neuro IR procedure, and intubated for airway protection.  Has hx of A fib >> coumadin held for ICD exchange 02/19/15.  STUDIES:  7/16 CT head >> no acute findings  SIGNIFICANT EVENTS: 7/16 Admit, neuro IR procedure >> revascularization of Rt M1, VDRF  SUBJECTIVE:  Remains on sedation.  VITAL SIGNS: Temp:  [98.4 F (36.9 C)-99.6 F (37.6 C)] 99.6 F (37.6 C) (07/17 0358) Pulse Rate:  [25-106] 43 (07/17 0700) Resp:  [12-34] 18 (07/17 0700) BP: (101-166)/(31-93) 117/71 mmHg (07/17 0700) SpO2:  [90 %-100 %] 96 % (07/17 0700) Arterial Line BP: (134-168)/(55-75) 162/70 mmHg (07/17 0700) FiO2 (%):  [30 %-40 %] 30 % (07/17 0249) Weight:  [212 lb 15.4 oz (96.6 kg)-220 lb 7.4 oz (100 kg)] 212 lb 15.4 oz (96.6 kg) (07/16 2000) VENTILATOR SETTINGS: Vent Mode:  [-] PRVC FiO2 (%):  [30 %-40 %] 30 % Set Rate:  [14 bmp] 14 bmp Vt Set:  [640 mL] 640 mL PEEP:  [5 cmH20] 5 cmH20 Plateau Pressure:  [14 cmH20] 14 cmH20 INTAKE / OUTPUT:  Intake/Output Summary (Last 24 hours) at 03/01/15 0745 Last data filed at 03/01/15 0700  Gross per 24 hour  Intake 2465.23 ml  Output    475 ml  Net 1990.23 ml    PHYSICAL EXAMINATION: General: sedated Neuro:  RASS -3 HEENT:  ETT in place Cardiovascular:  irregular Lungs:  No wheeze Abdomen:  Soft, non tender Musculoskeletal:  No edema Skin:  No rashes  LABS:  CBC  Recent Labs Lab 02/28/15 1545 02/28/15 1552 03/01/15 0230  WBC 5.6  --  8.3  HGB 13.0 13.6 11.9*  HCT 37.0* 40.0 34.5*  PLT 142*  --  123*   Coag's  Recent Labs Lab 02/28/15 1545  APTT 33  INR 1.54*   BMET  Recent Labs Lab 02/28/15 1545 02/28/15 1552  03/01/15 0230  NA 141 142 137  K 4.2 4.1 3.6  CL 108 105 105  CO2 23  --  27  BUN 14 17 11   CREATININE 0.76 0.70 0.56*  GLUCOSE 106* 104* 140*   Electrolytes  Recent Labs Lab 02/28/15 1545 03/01/15 0230  CALCIUM 9.3 8.5*   ABG  Recent Labs Lab 02/28/15 0904  PHART 7.410  PCO2ART 37.4  PO2ART 137*     Liver Enzymes  Recent Labs Lab 02/28/15 1545  AST 24  ALT 21  ALKPHOS 59  BILITOT 1.0  ALBUMIN 3.4*    Glucose  Recent Labs Lab 02/28/15 1544 02/28/15 1955  GLUCAP 97 141*    Imaging Ct Head Wo Contrast  02/28/2015   CLINICAL DATA:  79 year old male code stroke status post tPA. Left side symptoms are worsening. Initial encounter.  EXAM: CT HEAD WITHOUT CONTRAST  TECHNIQUE: Contiguous axial images were obtained from the base of the skull through the vertex without intravenous contrast.  COMPARISON:  Noncontrast head CT at 1552 hours today.  FINDINGS: No acute intracranial hemorrhage identified. No midline shift, mass effect, or evidence of intracranial mass lesion. Stable gray-white matter differentiation in both hemispheres. No evidence of cortically based acute infarction identified.  Prominent calcified plaque in the right supraclinoid ICA Re identified in addition to other  calcified skullbase atherosclerosis. No asymmetric intracranial vascular hyperdensity identified.  No ventriculomegaly. Small area of chronic encephalomalacia suspected in the left occipital lobe (series 2, image 11) and appears chin stable. Posterior fossa gray-white matter differentiation within normal limits.  Stable paranasal sinuses and mastoids. No acute osseous abnormality identified. Orbit and scalp soft tissues are within normal limits.  IMPRESSION: 1. Stable noncontrast CT appearance of the brain since 1552 hours. ASPECTS score remains [place] Stroke Program Early CT Score Normal score = 10 2. Calcified atherosclerosis at the skullbase, specially the supraclinoid right ICA. Small  chronic appearing focus of cortical encephalomalacia in the left occipital pole. Study reviewed in person with Dr. Roland Rack at 1638 hours on 02/28/2015.   Electronically Signed   By: Genevie Ann M.D.   On: 02/28/2015 16:48   Ct Head Wo Contrast  02/28/2015   CLINICAL DATA:  Code stroke.  Left-sided weakness.  EXAM: CT HEAD WITHOUT CONTRAST  TECHNIQUE: Contiguous axial images were obtained from the base of the skull through the vertex without intravenous contrast.  COMPARISON:  None.  FINDINGS: There is prominence of the sulci and ventricles consistent with brain atrophy. Low density structure within the left centrum semiovale noted compatible with chronic lacunar infarct. There is no evidence for acute brain infarct, intracranial hemorrhage or mass. No extra-axial fluid collections identified. The mastoid air cells and paranasal sinuses are clear. The calvarium appears in tact.  IMPRESSION: 1. No acute intracranial abnormalities.   Electronically Signed   By: Kerby Moors M.D.   On: 02/28/2015 16:06   Portable Chest Xray  03/01/2015   CLINICAL DATA:  Acute respiratory failure.  EXAM: PORTABLE CHEST - 1 VIEW  COMPARISON:  02/28/2015  FINDINGS: Endotracheal tube in place 6.3 cm above carina. Left-sided AICD leads to the right atrium, right ventricle. Heart is enlarged and stable. Persistent left base opacity. There has been some interval improvement in aeration of the left lower lobe. Persistent pulmonary vascular congestion.  IMPRESSION: 1. Cardiomegaly and vascular congestion. 2. Slight interval improvement in left lower lobe aeration.   Electronically Signed   By: Nolon Nations M.D.   On: 03/01/2015 07:38   Portable Chest Xray  02/28/2015   CLINICAL DATA:  Acute respiratory failure, recent stroke  EXAM: PORTABLE CHEST - 1 VIEW  COMPARISON:  None.  FINDINGS: Cardiac shadow is mildly enlarged. A defibrillator is noted endotracheal tube is noted in satisfactory position approximately 5.7 cm above  the carina. Aortic calcifications are seen. No focal infiltrate is noted.  IMPRESSION: No acute abnormality seen.  Aortic atherosclerotic disease.   Electronically Signed   By: Inez Catalina M.D.   On: 02/28/2015 21:22     ASSESSMENT / PLAN:  PULMONARY ETT 7/16 >>   A: Compromised airway in setting of CVA. Atelectasis. Hx of OSA. P:   Full vent support until neuro status stable F/u CXR BDs prn  CARDIOVASCULAR A:  Hx of HTN, A fib, complete heart block, s/p ICD, CAD, cardiomyopathy. Interstitial edema. P:  Monitor hemodynamics Resume lipitor Hold digoxin, lopressor Hold coumadin Add heparin gtt when okay with neurology/neuro-IR Cardene gtt per neuro IR F/u Echo Might need lasix to assist with vent weaning >> consider dose on 7/18  RENAL A:   No acute issues. P:   Monitor renal fx, urine outpt, electrolytes  GASTROINTESTINAL A:  Nutrition. P:   Tube feeds if unable to extubate soon Protonix for SUP  HEMATOLOGIC A:   Mild thrombocytopenia. P:  F/u CBC  SCD's  INFECTIOUS A:   No evidence for infection. P:   Monitor clinically  ENDOCRINE A:   DM type II.   P:   SSI Hold amaryl, glucophage  NEUROLOGIC A:   Acute Rt MCA CVA s/p neuro IR procedure. P:   RASS goal: -1 Per neurology, and neuro-IR F/u MRI brain   Chesley Mires, MD [place] 03/01/2015, 7:45 AM Pager:  [phone] After 3pm call: [phone]

## 2015-03-01 NOTE — Progress Notes (Signed)
Utilization Review Completed.[employer] T7/17/2016  

## 2015-03-02 ENCOUNTER — Encounter (HOSPITAL_COMMUNITY): Payer: Self-pay | Admitting: Interventional Radiology

## 2015-03-02 ENCOUNTER — Inpatient Hospital Stay (HOSPITAL_COMMUNITY): Payer: Medicare Other

## 2015-03-02 DIAGNOSIS — Z515 Encounter for palliative care: Secondary | ICD-10-CM

## 2015-03-02 DIAGNOSIS — I63511 Cerebral infarction due to unspecified occlusion or stenosis of right middle cerebral artery: Secondary | ICD-10-CM | POA: Insufficient documentation

## 2015-03-02 DIAGNOSIS — J9600 Acute respiratory failure, unspecified whether with hypoxia or hypercapnia: Secondary | ICD-10-CM | POA: Insufficient documentation

## 2015-03-02 DIAGNOSIS — R0600 Dyspnea, unspecified: Secondary | ICD-10-CM

## 2015-03-02 DIAGNOSIS — Z66 Do not resuscitate: Secondary | ICD-10-CM

## 2015-03-02 LAB — HEMOGLOBIN A1C
HEMOGLOBIN A1C: 7 % — AB (ref 4.8–5.6)
Mean Plasma Glucose: 154 mg/dL

## 2015-03-02 LAB — GLUCOSE, CAPILLARY: GLUCOSE-CAPILLARY: 138 mg/dL — AB (ref 65–99)

## 2015-03-02 LAB — PROTIME-INR
INR: 1.59 — AB (ref 0.00–1.49)
Prothrombin Time: 19 seconds — ABNORMAL HIGH (ref 11.6–15.2)

## 2015-03-02 MED ORDER — VITAMIN K1 10 MG/ML IJ SOLN
10.0000 mg | Freq: Once | INTRAVENOUS | Status: AC
  Administered 2015-03-02: 10 mg via INTRAVENOUS
  Filled 2015-03-02: qty 1

## 2015-03-02 MED ORDER — SODIUM CHLORIDE 0.9 % IV SOLN: 1000.0000 mL | INTRAVENOUS | Status: AC

## 2015-03-02 MED ORDER — GLYCOPYRROLATE 1 MG PO TABS: 1.0000 mg | ORAL_TABLET | ORAL | Status: AC | PRN

## 2015-03-02 MED ORDER — LORAZEPAM 2 MG/ML IJ SOLN
2.0000 mg | INTRAMUSCULAR | Status: DC | PRN
  Administered 2015-03-02 – 2015-03-03 (×3): 2 mg via INTRAVENOUS
  Filled 2015-03-02 (×3): qty 1

## 2015-03-02 MED ORDER — ACETAMINOPHEN 650 MG RE SUPP: 650.0000 mg | Freq: Four times a day (QID) | RECTAL | Status: AC | PRN

## 2015-03-02 MED ORDER — ONDANSETRON HCL 4 MG/2ML IJ SOLN: 4.0000 mg | Freq: Four times a day (QID) | INTRAMUSCULAR | Status: AC | PRN

## 2015-03-02 MED ORDER — ACETAMINOPHEN 500 MG PO TABS: 1000.0000 mg | ORAL_TABLET | Freq: Four times a day (QID) | ORAL | Status: AC | PRN

## 2015-03-02 MED ORDER — MORPHINE SULFATE 2 MG/ML IJ SOLN: 1.0000 mg | INTRAMUSCULAR | Status: DC | PRN

## 2015-03-02 MED ORDER — SODIUM CHLORIDE 0.9 % IV SOLN: 1.0000 mg/h | INTRAVENOUS | Status: DC

## 2015-03-02 NOTE — Progress Notes (Signed)
STROKE TEAM PROGRESS NOTE   HISTORY AIRON Oliver is a 79 y.o. male who was walking across a field when he was seen to collapse at 14:40 this afternoon. He was brought in via EMS and was seen to have left sided weakness.   He had his coumadin stopped for several days prior to a procedure on 02/19/2015 and was restarted on the coumadin following this(though date unclear per daughter, she initially said yesterday, but then later was not certain).   LKW: 14:40 tpa given?: yes, but infusion was then paused for neurological worsening but repeat CT scan did not show brain hemorrhage but TPA not restarted by Dr. Addison Lank as ProTime was elevated at 18.   SUBJECTIVE (INTERVAL HISTORY) Daughters are at bedside. Overnight, pt has been extubated, still unresponsive and put on comfort care measures. Will have palliative care consult. His INR 1.7 and was given VitK and INR 1.59 after, femoral sheath was pulled. Pt will be transfer to floor 4 hours after sheath out.   OBJECTIVE Temp:  [98.8 F (37.1 C)] 98.8 F (37.1 C) (07/17 1603) Pulse Rate:  [27-96] 91 (07/18 0600) Cardiac Rhythm:  [-] Other (Comment) (07/18 0800) Resp:  [14-23] 18 (07/18 0700) BP: (111-152)/(51-82) 134/60 mmHg (07/17 2000) SpO2:  [81 %-100 %] 90 % (07/18 0700) Arterial Line BP: (133-174)/(60-80) 152/72 mmHg (07/18 0700) FiO2 (%):  [30 %] 30 % (07/17 2000)   Recent Labs Lab 02/28/15 1544 02/28/15 1745 02/28/15 1955  GLUCAP 97 138* 141*    Recent Labs Lab 02/28/15 1545 02/28/15 1552 03/01/15 0230  NA 141 142 137  K 4.2 4.1 3.6  CL 108 105 105  CO2 23  --  27  GLUCOSE 106* 104* 140*  BUN 14 17 11   CREATININE 0.76 0.70 0.56*  CALCIUM 9.3  --  8.5*    Recent Labs Lab 02/28/15 1545  AST 24  ALT 21  ALKPHOS 59  BILITOT 1.0  PROT 6.7  ALBUMIN 3.4*    Recent Labs Lab 02/28/15 1545 02/28/15 1552 03/01/15 0230  WBC 5.6  --  8.3  NEUTROABS 2.4  --  6.1  HGB 13.0 13.6 11.9*  HCT 37.0* 40.0 34.5*  MCV 98.9   --  99.1  PLT 142*  --  123*   No results for input(s): CKTOTAL, CKMB, CKMBINDEX, TROPONINI in the last 168 hours.  Recent Labs  02/28/15 1545 03/01/15 0853 03/02/15 1148  LABPROT 18.6* 20.0* 19.0*  INR 1.54* 1.71* 1.59*   No results for input(s): COLORURINE, LABSPEC, PHURINE, GLUCOSEU, HGBUR, BILIRUBINUR, KETONESUR, PROTEINUR, UROBILINOGEN, NITRITE, LEUKOCYTESUR in the last 72 hours.  Invalid input(s): APPERANCEUR     Component Value Date/Time   CHOL 100 03/01/2015 0230   TRIG 68 03/01/2015 0230   HDL 33* 03/01/2015 0230   CHOLHDL 3.0 03/01/2015 0230   VLDL 14 03/01/2015 0230   LDLCALC 53 03/01/2015 0230   Lab Results  Component Value Date   HGBA1C 7.0* 03/01/2015   No results found for: LABOPIA, COCAINSCRNUR, LABBENZ, AMPHETMU, THCU, LABBARB  No results for input(s): ETH in the last 168 hours.   Imaging  CT head 03/01/15 1. Large right MCA infarct with cytotoxic edema and new intracranial mass effect. Leftward midline shift of 12 mm. No malignant hemorrhagic transformation. 2. Effaced right lateral ventricle. No ventriculomegaly. 3. Patchy cortically based infarcts also in the left ACA territory.  Ct Head Wo Contrast  02/28/2015    1. Stable noncontrast CT appearance of the brain since 1552 hours. ASPECTS  score remains [place] Stroke Program Early CT Score Normal score = 10  2. Calcified atherosclerosis at the skullbase, specially the supraclinoid right ICA. Small chronic appearing focus of cortical encephalomalacia in the left occipital pole.   Ct Head Wo Contrast  02/28/2015    1. No acute intracranial abnormalities.    Portable Chest Xray 03/01/2015    1. Cardiomegaly and vascular congestion.  2. Slight interval improvement in left lower lobe aeration.     PHYSICAL EXAM Elderly Caucasian male who is intubated. He is off sedation for 2 hours. Afebrile. Head is nontraumatic. Neck is supple without bruit.    Cardiac exam no murmur or gallop. Lungs are  clear to auscultation. Distal pulses are well felt. He has groin arterial sheath  Neurological Exam :  Comatose and unresponsive. Eyes closed. Not following any commands. Right gaze deviation with some spontaneous side to side eye moments up to midline. Pupils small 2 mm and sluggishly reactive. Fundi were not visualized. Corneal reflexes are present bilaterally. Cough and gag are present. Tongue is midline. Motor system exam revealed dense left hemiplegia with hypotonia. Some spontaneous right upper and lower extremity moments and brisk withdrawal to pain on the right. Left plantar upgoing right downgoing.  ASSESSMENT/PLAN Mr. Evan Oliver is a 79 y.o. male with history of hypertension, coronary artery disease, IHSS, diabetes mellitus, cardiomyopathy, atrial fibrillation on Coumadin therapy which had recently been held for a defibrillator exchange on 02/19/2015 presenting with collapse and left-sided weakness. He initially received IV TPA; however, this was discontinued secondary to a worsening in the patient's neurological status. The patient absolutely went to interventional radiology for a cerebral angiogram, thrombectomy and intra-arterial TPA.  Stroke:  Non-dominant infarct felt to be embolic secondary to atrial fibrillation.  Resultant   MRI - AICD  MRA  - AICD  Carotid Doppler cancelled due to comfort care  2D Echo cancelled due to comfort care  LDL 53  HgbA1c 7.0 Diet NPO time specified  aspirin 81 mg orally every day and warfarin prior to admission, now on no antithrombotic secondary to comfort care  Palliative consult requested.  Hypertension  Home meds: Lopressor  Stable  Comfort care  Hyperlipidemia  Home meds:  Lipitor 40 mg daily   LDL 53, goal < 70  Diabetes  HgbA1c 7.0  Controlled  Other Stroke Risk Factors  Advanced age  Coronary artery disease  Other Active Problems  NPO  Elevated INR - s/p VitK  Mild anemia  Other Pertinent  History  Hospital day # 2  Evan Hawking, MD PhD Stroke Neurology 03/02/2015 3:02 PM    To contact Stroke Continuity provider, please refer to [email or web address] After hours, contact General Neurology

## 2015-03-02 NOTE — Consult Note (Addendum)
Consultation Note Date: 03/02/2015   Patient Name: Evan Oliver  DOB: [birth year]  MRN: [id number]  Age / Sex: 79 y.o., male   PCP: Juluis Pitch, MD Referring Physician: Rosalin Hawking, MD  Reason for Consultation: Disposition, Inpatient hospice referral, Psychosocial/spiritual support and Terminal care  Palliative Care Assessment and Plan Summary of Established Goals of Care and Medical Treatment Preferences    Palliative Care Discussion Held Today:    This NP Wadie Lessen reviewed medical records, received report from team, assessed the patient and then meet at the patient's bedside along with his daughter/HPOA Evan Oliver to discuss diagnosis, prognosis, GOC, EOL wishes disposition and options.  A  discussion was had today regarding advanced directives.  Concepts specific to code status, artifical feeding and hydration, continued IV antibiotics and rehospitalization was had.  The difference between a aggressive medical intervention path  and a palliative comfort care path for this patient at this time was had.  Values and goals of care important to patient and family were attempted to be elicited.  Questions and concerns addressed.  Family encouraged to call with questions or concerns.  PMT will continue to support holistically.  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  Will move forward with a referral to inpatient facility.       Goals of Care/Code Status/Advance Care Planning:  Focus of care is comfort, quality and dignity.  No further artifical feeding or hydration or diagnostics.  Symptom Management:    Pain/Dyspnea:  Morphine gtt, may titrate to comfort.  Psycho-social/Spiritual:   Support System: Two daughters and many supportive family members  Desire for further Chaplaincy support:no  Strong community  church support  Prognosis: < 2 weeks  Discharge Planning:  Hospice facility       Chief Complaint: Left sided weakness  History of Present Illness:   Evan Oliver is a 79 y.o. male who was walking across a field when he was seen to collapse at 14:40 this afternoon. He was brought in via EMS and was seen to have left sided weakness.   He had his coumadin stopped for several days prior to a procedure on 02/19/2015 and was restarted on the coumadin following this (though date unclear per daughter, she initially said yesterday, but then later was not certain).   HOSPITAL COURSE Pt was then sent to IR suite for thrombectomy. Found to have right M1 occlusion with TICI 2a recanalization. However, pt continued to be unresponsive and repeat CT showed large right MCA malignant infarct with cerebral edema and midline shift. Poor prognosis. After lengthy discussion, family requested comfort care. Pt was extubated and palliative care consulted and pt put on comfort care measures. Sheath was pulled off after INR < 1.6 and pt then transferred to residential hospice.  Stroke: Non-dominant infarct felt to be embolic secondary to atrial fibrillation.  Primary Diagnoses  Present on Admission:  . Stroke  Palliative Review of Systems:    -unable to illicit die to decreased cognition   I have reviewed the medical record, interviewed the patient and family, and examined the patient. The following aspects are pertinent.  Past Medical History  Diagnosis Date  . Dysrhythmia     atrial fibrillation  . Hypertension   . IHSS (idiopathic hypertrophic subaortic stenosis)   . Coronary atherosclerosis of native coronary artery   . Benign neoplasm of colon   .  Diabetes mellitus without complication   . Arthritis   . Nephrolithiasis   . Gout   . Sleep apnea   . Shingles   . History of gastritis   . Cardiomyopathy, secondary    History   Social History  . Marital Status: Single    Spouse Name: N/A    . Number of Children: N/A  . Years of Education: N/A   Social History Main Topics  . Smoking status: Never Smoker   . Smokeless tobacco: Never Used  . Alcohol Use: No  . Drug Use: No  . Sexual Activity: Not on file   Other Topics Concern  . None   Social History Narrative   History reviewed. No pertinent family history. Scheduled Meds:  Continuous Infusions: . sodium chloride 10 mL/hr at 03/01/15 2200  . sodium chloride    . morphine 1 mg/hr (03/01/15 2015)   PRN Meds:.acetaminophen **OR** acetaminophen, acetaminophen **OR** acetaminophen, glycopyrrolate **OR** glycopyrrolate **OR** glycopyrrolate, morphine injection, ondansetron (ZOFRAN) IV Medications Prior to Admission:  Prior to Admission medications   Medication Sig Start Date End Date Taking? Authorizing Provider  Ascorbic Acid (VITAMIN C) 1000 MG tablet Take 1,000 mg by mouth daily.   Yes Historical Provider, MD  aspirin EC 81 MG tablet Take 81 mg by mouth daily.   Yes Historical Provider, MD  atorvastatin (LIPITOR) 40 MG tablet Take 40 mg by mouth daily.   Yes Historical Provider, MD  cephALEXin (KEFLEX) 250 MG capsule Take 1 capsule (250 mg total) by mouth 4 (four) times daily. 02/19/15  Yes Isaias Cowman, MD  cyanocobalamin 1000 MCG tablet Take 100 mcg by mouth daily.   Yes Historical Provider, MD  digoxin (LANOXIN) 0.125 MG tablet Take 0.125 mg by mouth daily.   Yes Historical Provider, MD  fluticasone (FLONASE) 50 MCG/ACT nasal spray Place 2 sprays into the nose daily.  10/09/14  Yes Historical Provider, MD  glimepiride (AMARYL) 1 MG tablet Take 1 mg by mouth daily. 02/18/15  Yes Historical Provider, MD  indomethacin (INDOCIN) 25 MG capsule Take 25 mg by mouth daily as needed (for gout).  12/15/14  Yes Historical Provider, MD  Iron, Ferrous Gluconate, 256 (28 FE) MG TABS Take by mouth.   Yes Historical Provider, MD  loratadine (CLARITIN) 10 MG tablet Take 10 mg by mouth daily.    Yes Historical Provider, MD   metFORMIN (GLUCOPHAGE) 500 MG tablet Take 1,000 mg by mouth 2 (two) times daily with a meal.   Yes Historical Provider, MD  metoprolol (LOPRESSOR) 50 MG tablet Take 50 mg by mouth 2 (two) times daily.   Yes Historical Provider, MD  Multiple Vitamin (MULTI-VITAMINS) TABS Take 1 tablet by mouth daily.   Yes Historical Provider, MD  Omega-3 Fatty Acids (FISH OIL) 1000 MG CAPS Take 1 capsule by mouth daily.   Yes Historical Provider, MD  Potassium 99 MG TABS Take 99 mg by mouth daily.   Yes Historical Provider, MD  warfarin (COUMADIN) 1 MG tablet Take 0.5 mg by mouth daily.   Yes Historical Provider, MD  warfarin (COUMADIN) 3 MG tablet Take 3 mg by mouth daily.   Yes Historical Provider, MD   Allergies  Allergen Reactions  . Tylenol [Acetaminophen] Other (See Comments)    Heart condition contraindication   CBC:    Component Value Date/Time   WBC 8.3 03/01/2015 0230   HGB 11.9* 03/01/2015 0230   HCT 34.5* 03/01/2015 0230   PLT 123* 03/01/2015 0230   MCV 99.1 03/01/2015 0230  NEUTROABS 6.1 03/01/2015 0230   LYMPHSABS 1.3 03/01/2015 0230   MONOABS 0.8 03/01/2015 0230   EOSABS 0.0 03/01/2015 0230   BASOSABS 0.0 03/01/2015 0230   Comprehensive Metabolic Panel:    Component Value Date/Time   NA 137 03/01/2015 0230   K 3.6 03/01/2015 0230   CL 105 03/01/2015 0230   CO2 27 03/01/2015 0230   BUN 11 03/01/2015 0230   CREATININE 0.56* 03/01/2015 0230   GLUCOSE 140* 03/01/2015 0230   CALCIUM 8.5* 03/01/2015 0230   AST 24 02/28/2015 1545   ALT 21 02/28/2015 1545   ALKPHOS 59 02/28/2015 1545   BILITOT 1.0 02/28/2015 1545   PROT 6.7 02/28/2015 1545   ALBUMIN 3.4* 02/28/2015 1545    Physical Exam:  Vital Signs: BP 134/60 mmHg  Pulse 91  Temp(Src) 98.8 F (37.1 C) (Oral)  Resp 18  Ht 6\' 1"  (1.854 m)  Wt 96.6 kg (212 lb 15.4 oz)  BMI 28.10 kg/m2  SpO2 90% SpO2: SpO2: 90 % O2 Device: O2 Device: Ventilator O2 Flow Rate:   Intake/output summary:  Intake/Output Summary (Last 24  hours) at 03/02/15 1508 Last data filed at 03/02/15 0700  Gross per 24 hour  Intake 560.75 ml  Output    635 ml  Net -74.25 ml   LBM:   Baseline Weight: Weight: 100 kg (220 lb 7.4 oz) Most recent weight: Weight: 96.6 kg (212 lb 15.4 oz)  Exam Findings:   General: Chronically ill appearing, unresponsive HEENT: dry buccal membranes CVS: RRR Resp: CTA, mouth breathing Skin: Skin is warm and dry            Palliative Performance Scale: 20  %                Additional Data Reviewed: Recent Labs     02/28/15  1545  02/28/15  1552  03/01/15  0230  WBC  5.6   --   8.3  HGB  13.0  13.6  11.9*  PLT  142*   --   123*  NA  141  142  137  BUN  14  17  11   CREATININE  0.76  0.70  0.56*     Time In: 1430 Time Out: 1545 Time Total: 75 minutes  Greater than 50%  of this time was spent counseling and coordinating care related to the above assessment and plan.  Discussed with  Dr  Erlinda Hong  Signed by: Wadie Lessen, NP  Knox Royalty, NP  03/02/2015, 3:08 PM  Please contact Palliative Medicine Team phone at [phone] for questions and concerns.   See AMION for contact information

## 2015-03-02 NOTE — Progress Notes (Signed)
PT Cancellation Note  Patient Details Name: Evan Oliver MRN: [id number] DOB: 1936/07/20   Cancelled Treatment:    Reason Eval/Treat Not Completed: Patient not medically ready.  Will sign off at this time. 03/02/2015  Donnella Sham, [place] [phone]  (pager)   Evan Oliver, [name] 03/02/2015, 10:30 AM

## 2015-03-02 NOTE — Progress Notes (Signed)
SLP Cancellation Note  Patient Details Name: Evan Oliver MRN: [id number] DOB: 1935/09/07   Cancelled treatment:       Reason Eval/Treat Not Completed: Patient not medically ready. Will sign off.    Cleto Claggett, Katherene Ponto 03/02/2015, 7:31 AM

## 2015-03-02 NOTE — Progress Notes (Signed)
Occupational Therapy Discharge Patient Details Name: Evan Oliver MRN: [id number] DOB: [birth year] Today's Date: 03/02/2015 Time:  -     Patient discharged from OT services secondary to medical decline - will need to re-order OT to resume therapy services.  Please see latest therapy progress note for current level of functioning and progress toward goals.    Progress and discharge plan discussed with patient and/or caregiver: Eval not initiated. Pt with neuroligal worsening and family moving toward comfort care.  OT signing off.   GO     Lucille Passy M 03/02/2015, 10:51 AM

## 2015-03-02 NOTE — Discharge Summary (Addendum)
Stroke Discharge Summary  Patient ID: Evan Oliver   MRN: [id number]      DOB: 1936/08/08  Date of Admission: 02/28/2015 Date of Discharge: 03/03/2015  Attending Physician:  Rosalin Hawking, MD, Stroke MD  Consulting Physician(s):   Treatment Team:  Palliative [facility] pulmonary/intensive care  Patient's PCP:  Juluis Pitch, MD  DISCHARGE DIAGNOSIS:  Active Problems:   Stroke   Right large MCA malignant infarct   Right M1 occlusion s/p mechanical thrombectomy   Cytotoxic brain edema  BMI: Body mass index is 28.1 kg/(m^2).  Past Medical History  Diagnosis Date  . Dysrhythmia     atrial fibrillation  . Hypertension   . IHSS (idiopathic hypertrophic subaortic stenosis)   . Coronary atherosclerosis of native coronary artery   . Benign neoplasm of colon   . Diabetes mellitus without complication   . Arthritis   . Nephrolithiasis   . Gout   . Sleep apnea   . Shingles   . History of gastritis   . Cardiomyopathy, secondary    Past Surgical History  Procedure Laterality Date  . Appendectomy    . Rotator cuff repair Right   . Abdominal wall cyst x2    . Cardiac catheterization    . Cardiac defibrillator placement    . Colonoscopy    . Pacemaker insertion N/A 02/19/2015    Procedure: INSERTION PACEMAKER/  CHANGE OUT;  Surgeon: Isaias Cowman, MD;  Location: [facility]S;  Service: Cardiovascular;  Laterality: N/A;  . Radiology with anesthesia N/A 02/28/2015    Procedure: RADIOLOGY WITH ANESTHESIA;  Surgeon: Luanne Bras, MD;  Location: [place];  Service: Radiology;  Laterality: N/A;      Medication List    STOP taking these medications        aspirin EC 81 MG tablet     atorvastatin 40 MG tablet  Commonly known as:  LIPITOR     cephALEXin 250 MG capsule  Commonly known as:  KEFLEX     cyanocobalamin 1000 MCG tablet     digoxin 0.125 MG tablet  Commonly known as:  LANOXIN     Fish Oil 1000 MG Caps     fluticasone 50 MCG/ACT nasal spray  Commonly known as:   FLONASE     glimepiride 1 MG tablet  Commonly known as:  AMARYL     indomethacin 25 MG capsule  Commonly known as:  INDOCIN     Iron (Ferrous Gluconate) 256 (28 FE) MG Tabs     loratadine 10 MG tablet  Commonly known as:  CLARITIN     metFORMIN 500 MG tablet  Commonly known as:  GLUCOPHAGE     metoprolol 50 MG tablet  Commonly known as:  LOPRESSOR     MULTI-VITAMINS Tabs     Potassium 99 MG Tabs     vitamin C 1000 MG tablet     warfarin 1 MG tablet  Commonly known as:  COUMADIN     warfarin 3 MG tablet  Commonly known as:  COUMADIN      TAKE these medications        acetaminophen 500 MG tablet  Commonly known as:  TYLENOL  Take 2 tablets (1,000 mg total) by mouth every 6 (six) hours as needed (increased temp or pain).     acetaminophen 650 MG suppository  Commonly known as:  TYLENOL  Place 1 suppository (650 mg total) rectally every 6 (six) hours as needed (increased temp or pain).  glycopyrrolate 1 MG tablet  Commonly known as:  ROBINUL  Take 1 tablet (1 mg total) by mouth every 4 (four) hours as needed (excessive secretions).     morphine 2 MG/ML injection  Inject 0.5-2 mLs (1-4 mg total) into the vein every hour as needed (or dyspnea).     morphine 250 mg in sodium chloride 0.9 % 240 mL  Inject 1 mg/hr into the vein continuous.     ondansetron 4 MG/2ML Soln injection  Commonly known as:  ZOFRAN  Inject 2 mLs (4 mg total) into the vein every 6 (six) hours as needed for nausea or vomiting.     sodium chloride 0.9 % infusion  Inject 1,000 mLs into the vein continuous.        LABORATORY STUDIES CBC    Component Value Date/Time   WBC 8.3 03/01/2015 0230   RBC 3.48* 03/01/2015 0230   HGB 11.9* 03/01/2015 0230   HCT 34.5* 03/01/2015 0230   PLT 123* 03/01/2015 0230   MCV 99.1 03/01/2015 0230   MCH 34.2* 03/01/2015 0230   MCHC 34.5 03/01/2015 0230   RDW 13.0 03/01/2015 0230   LYMPHSABS 1.3 03/01/2015 0230   MONOABS 0.8 03/01/2015 0230    EOSABS 0.0 03/01/2015 0230   BASOSABS 0.0 03/01/2015 0230   CMP    Component Value Date/Time   NA 137 03/01/2015 0230   K 3.6 03/01/2015 0230   CL 105 03/01/2015 0230   CO2 27 03/01/2015 0230   GLUCOSE 140* 03/01/2015 0230   BUN 11 03/01/2015 0230   CREATININE 0.56* 03/01/2015 0230   CALCIUM 8.5* 03/01/2015 0230   PROT 6.7 02/28/2015 1545   ALBUMIN 3.4* 02/28/2015 1545   AST 24 02/28/2015 1545   ALT 21 02/28/2015 1545   ALKPHOS 59 02/28/2015 1545   BILITOT 1.0 02/28/2015 1545   GFRNONAA >60 03/01/2015 0230   GFRAA >60 03/01/2015 0230   COAGS Lab Results  Component Value Date   INR 1.59* 03/02/2015   INR 1.71* 03/01/2015   INR 1.54* 02/28/2015   Lipid Panel    Component Value Date/Time   CHOL 100 03/01/2015 0230   TRIG 68 03/01/2015 0230   HDL 33* 03/01/2015 0230   CHOLHDL 3.0 03/01/2015 0230   VLDL 14 03/01/2015 0230   LDLCALC 53 03/01/2015 0230   HgbA1C  Lab Results  Component Value Date   HGBA1C 7.0* 03/01/2015   Cardiac Panel (last 3 results) No results for input(s): CKTOTAL, CKMB, TROPONINI, RELINDX in the last 72 hours. Urinalysis No results found for: COLORURINE, APPEARANCEUR, LABSPEC, PHURINE, GLUCOSEU, HGBUR, BILIRUBINUR, KETONESUR, PROTEINUR, UROBILINOGEN, NITRITE, LEUKOCYTESUR Urine Drug Screen No results found for: LABOPIA, COCAINSCRNUR, LABBENZ, AMPHETMU, THCU, LABBARB  Alcohol Level No results found for: [facility]   SIGNIFICANT DIAGNOSTIC STUDIES CT head 03/01/15 1. Large right MCA infarct with cytotoxic edema and new intracranial mass effect. Leftward midline shift of 12 mm. No malignant hemorrhagic transformation. 2. Effaced right lateral ventricle. No ventriculomegaly. 3. Patchy cortically based infarcts also in the left ACA territory.  Ct Head Wo Contrast  02/28/2015  1. Stable noncontrast CT appearance of the brain since 1552 hours. ASPECTS score remains [place] Stroke Program Early CT Score Normal score = 10  2. Calcified  atherosclerosis at the skullbase, specially the supraclinoid right ICA. Small chronic appearing focus of cortical encephalomalacia in the left occipital pole.   Ct Head Wo Contrast  02/28/2015  1. No acute intracranial abnormalities.   Portable Chest Xray 03/01/2015  1. Cardiomegaly and vascular  congestion.  2. Slight interval improvement in left lower lobe aeration.    HISTORY OF PRESENT ILLNESS Evan Oliver is a 79 y.o. male who was walking across a field when he was seen to collapse at 14:40 this afternoon. He was brought in via EMS and was seen to have left sided weakness.   He had his coumadin stopped for several days prior to a procedure on 02/19/2015 and was restarted on the coumadin following this(though date unclear per daughter, she initially said yesterday, but then later was not certain).   LKW: 14:40 tpa given?: yes, but infusion was then paused for neurological worsening but repeat CT scan did not show brain hemorrhage but TPA not restarted by Dr. Addison Lank as ProTime was elevated at 18.    HOSPITAL COURSE Pt was then sent to IR suite for thrombectomy. Found to have right M1 occlusion with TICI 2a recanalization. However, pt continued to be unresponsive and repeat CT showed large right MCA malignant infarct with cerebral edema and midline shift. Poor prognosis. After lengthy discussion, family requested comfort care. Pt was extubated and palliative care consulted and pt put on comfort care measures. Sheath was pulled off after INR < 1.6 and pt then transferred to residential hospice.  Stroke: Non-dominant infarct felt to be embolic secondary to atrial fibrillation.  LDL 53  HgbA1c 7.0  Diet NPO time specified  aspirin 81 mg orally every day and warfarin prior to admission, now on no antithrombotic secondary to comfort care  Palliative consult requested.  Cerebral edema  With midline shift  Family requested comfort care  Palliative care on board  Currently on  morphine drip.  Hypertension  Home meds: Lopressor  Stable  Comfort care  Hyperlipidemia  Home meds: Lipitor 40 mg daily   LDL 53, goal < 70  Diabetes  HgbA1c 7.0  Controlled  Other Stroke Risk Factors  Advanced age  Coronary artery disease  Other Active Problems  NPO  Elevated INR - s/p VitK  Mild anemia  Under comfort care measures  DISCHARGE EXAM Afebrile. Head is nontraumatic. Neck is supple without bruit. Cardiac exam no murmur or gallop. Lungs are clear to auscultation. Distal pulses are well felt.   Neurological Exam :  Comatose and unresponsive. Eyes closed. Not following any commands. Right gaze deviation. Pupils small 2 mm and sluggishly reactive. Left facial droop. Motor system exam revealed dense left hemiplegia with hypotonia. No spontaneous movements. Exam limited due to comfort care.  Discharge Diet   Diet NPO time specified liquids  DISCHARGE PLAN  Disposition:  Residential hospice   Continue comfort care measures  35 minutes were spent preparing discharge.  Rosalin Hawking, MD PhD Stroke Neurology 03/03/2015 3:48 PM

## 2015-03-02 NOTE — Progress Notes (Signed)
Right 47fr femoral sheath removal, no hematoma present before sheath pull, 61fr exoceal deployed and held manual pressure for 20 minutes.  Tegaderm and pressure applied along with a 10lb sandbag. Verified site with Katie RN at 1315.  France Ravens RT R, [place]

## 2015-03-02 NOTE — Care Management Note (Signed)
Case Management Note  Patient Details  Name: BURRELL HODAPP MRN: [id number] Date of Birth: 07-12-1936  Subjective/Objective: Pt admitted on 02/28/15 s/p large MCA infarct.  PTA, pt independent of ADLS.                     Action/Plan: Pt with poor prognosis.  Family has decided to extubate, make DNR, and offer comfort care.    Expected Discharge Date:   (unknown)               Expected Discharge Plan:  Expired  In-House Referral:     Discharge planning Services  CM Consult  Post Acute Care Choice:    Choice offered to:     DME Arranged:    DME Agency:     HH Arranged:    HH Agency:     Status of Service:  In process, will continue to follow  Medicare Important Message Given:    Date Medicare IM Given:    Medicare IM give by:    Date Additional Medicare IM Given:    Additional Medicare Important Message give by:     If discussed at [place] of Stay Meetings, dates discussed:    Additional Comments:  Reinaldo Raddle, RN, BSN  Trauma/Neuro ICU Case Manager [phone]

## 2015-03-03 DIAGNOSIS — Z515 Encounter for palliative care: Secondary | ICD-10-CM

## 2015-03-03 DIAGNOSIS — Z66 Do not resuscitate: Secondary | ICD-10-CM

## 2015-03-03 DIAGNOSIS — R0600 Dyspnea, unspecified: Secondary | ICD-10-CM

## 2015-03-03 MED ORDER — MORPHINE SULFATE 25 MG/ML IV SOLN: 15.0000 mg/h | INTRAVENOUS | Status: AC

## 2015-03-03 MED ORDER — SCOPOLAMINE 1 MG/3DAYS TD PT72
1.0000 | MEDICATED_PATCH | TRANSDERMAL | Status: DC
  Administered 2015-03-03: 1.5 mg via TRANSDERMAL
  Filled 2015-03-03: qty 1

## 2015-03-03 MED ORDER — LORAZEPAM 2 MG/ML IJ SOLN: 2.0000 mg | INTRAMUSCULAR | Status: AC | PRN

## 2015-03-03 MED ORDER — SCOPOLAMINE 1 MG/3DAYS TD PT72: 1.0000 | MEDICATED_PATCH | TRANSDERMAL | Status: AC

## 2015-03-03 NOTE — Progress Notes (Signed)
NT suctioned copious amount of tenacious tan/yellow secretions with two passes in both nares. Pt daughter is in the room and is concerned of her fathers well being. RN aware.

## 2015-03-03 NOTE — Progress Notes (Signed)
Pt nts via right nare x3 passes for large amount thick creamy secretions. BBS with coarse rhonchi before and after sx. 02 in use @ 2lp [place]. Family member at bedside.

## 2015-03-03 NOTE — Clinical Social Work Note (Signed)
Patient to be d/c'ed today to [place].  Patient and family agreeable to plans will transport via ems RN to call report.  Evette Cristal, MSW, [place]

## 2015-03-03 NOTE — Progress Notes (Signed)
RT Note: Rt came to check in on patient and to offer assistance. Several family members are at bedside as well as the chaplain. Family states that patient is well and does not need anything from me currently. Rt will continue to monitor and assist as needed.

## 2015-03-03 NOTE — Progress Notes (Signed)
Daily Progress Note   Patient Name: Evan Oliver       Date: 03/03/2015 DOB: 1936-04-03  Age: 79 y.o. MRN#: [id number] Attending Physician: Rosalin Hawking, MD Primary Care Physician: Juluis Pitch, MD Admit Date: 02/28/2015  Reason for Consultation/Follow-up: Inpatient hospice referral, Non pain symptom management, Pain control, Psychosocial/spiritual support and Terminal care  Subjective:     -patient remains unresponsive but appears comfortable, large family at bedside.  VS stable  BP 110/70  -continued conversation regarding natural trajectory and expectations at EOL, daughter struggles with decision if transport to [facility]  is the "right thing to do".   -Permission to travel with patient for transport approved, this is comforting to [employer] of Stay: 3 days  Current Medications: Scheduled Meds:  . scopolamine  1 patch Transdermal Q72H    Continuous Infusions: . sodium chloride 10 mL/hr at 03/02/15 1635  . sodium chloride    . morphine 15 mg/hr (03/03/15 0813)    PRN Meds: acetaminophen **OR** acetaminophen, glycopyrrolate **OR** glycopyrrolate **OR** glycopyrrolate, LORazepam, morphine injection, ondansetron (ZOFRAN) IV  Palliative Performance Scale: 20 % at best     Vital Signs: BP 108/49 mmHg  Pulse 143  Temp(Src) 99.8 F (37.7 C) (Axillary)  Resp 12  Ht 6\' 1"  (1.854 m)  Wt 96.6 kg (212 lb 15.4 oz)  BMI 28.10 kg/m2  SpO2 92% SpO2: SpO2: 92 % O2 Device: O2 Device: Nasal Cannula O2 Flow Rate: O2 Flow Rate (L/min): 2 L/min  Intake/output summary:  Intake/Output Summary (Last 24 hours) at 03/03/15 1350 Last data filed at 03/03/15 1335  Gross per 24 hour  Intake 424.21 ml  Output    750 ml  Net -325.79 ml   LBM:   Baseline Weight: Weight: 100 kg (220 lb 7.4 oz) Most recent weight: Weight: 96.6 kg (212 lb 15.4 oz)  Physical Exam:             General: unresponsive, appears comfortable HEENT: moist buccal membranes, no exudate CVS:  tacycardic Resp: RRR Skin: warm and dry, no mottling noted    Additional Data Reviewed: Recent Labs     02/28/15  1545  02/28/15  1552  03/01/15  0230  WBC  5.6   --   8.3  HGB  13.0  13.6  11.9*  PLT  142*   --   123*  NA  141  142  137  BUN  14  17  11   CREATININE  0.76  0.70  0.56*     Problem List:  Patient Active Problem List   Diagnosis Date Noted  . Acute right MCA stroke   . Acute respiratory failure   . Cytotoxic brain edema   . Stroke 02/28/2015     Palliative Care Assessment & Plan    Code Status:  DNR  Goals of Care:  Focus of care is full comfort  3. Symptom Management:   Dyspnea/Pain: now on Morphine gtt at 15 mg/hr  4. Prognosis: Hours - Days  5. Discharge Planning: Hospice facility-transport is planned for this afternoon   Care plan was discussed with [facility]   CMRN/Heather  Thank you for allowing the Palliative Medicine Team to assist in the care of this patient.   Time In: 1555 Time Out: 1630 Total Time 35 min Prolonged Time Billed  no     Greater than 50%  of this time was spent counseling and coordinating care related to the above assessment and plan.  Knox Royalty, NP  03/03/2015, 1:50 PM  Please contact Palliative Medicine Team phone at [phone] for questions and concerns.

## 2015-03-03 NOTE — Progress Notes (Signed)
Chaplain was referred by on call Chaplain. Chaplain offer spiritual support for family. There was a large gathering in the room. Daughter say that pastors have come by for prayer. Chaplain offer ficilated d story telling and offered prayer for the family.  Chaplain prayer and let family know the Spiritual care services are available as needed.    03/03/15 1500  Clinical Encounter Type  Visited With Patient and family together  Visit Type Spiritual support  Referral From Chaplain  Spiritual Encounters  Spiritual Needs Prayer;Emotional;Grief support  Stress Factors  Family Stress Factors Loss

## 2015-03-03 NOTE — Progress Notes (Signed)
STROKE TEAM PROGRESS NOTE   HISTORY Evan Oliver is a 79 y.o. male who was walking across a field when he was seen to collapse at 14:40 this afternoon. He was brought in via EMS and was seen to have left sided weakness.   He had his coumadin stopped for several days prior to a procedure on 02/19/2015 and was restarted on the coumadin following this(though date unclear per daughter, she initially said yesterday, but then later was not certain).   LKW: 14:40 tpa given?: yes, but infusion was then paused for neurological worsening but repeat CT scan did not show brain hemorrhage but TPA not restarted by Dr. Addison Lank as ProTime was elevated at 18.   SUBJECTIVE (INTERVAL HISTORY) Families are at bedside. Overnight, pt transferred to [facility] for inpt hospice. He needed deep suctioning for secretions. This am, he was put on scopolamine. Breathing much improved.   OBJECTIVE Temp:  [99.8 F (37.7 C)-101.7 F (38.7 C)] 99.8 F (37.7 C) (07/19 0738) Pulse Rate:  [114-143] 143 (07/19 0925) Cardiac Rhythm:  [-]  Resp:  [12-18] 12 (07/19 0925) BP: (108-153)/(49-97) 108/49 mmHg (07/19 0738) SpO2:  [86 %-92 %] 92 % (07/19 0925)   Recent Labs Lab 02/28/15 1544 02/28/15 1745 02/28/15 1955  GLUCAP 97 138* 141*    Recent Labs Lab 02/28/15 1545 02/28/15 1552 03/01/15 0230  NA 141 142 137  K 4.2 4.1 3.6  CL 108 105 105  CO2 23  --  27  GLUCOSE 106* 104* 140*  BUN 14 17 11   CREATININE 0.76 0.70 0.56*  CALCIUM 9.3  --  8.5*    Recent Labs Lab 02/28/15 1545  AST 24  ALT 21  ALKPHOS 59  BILITOT 1.0  PROT 6.7  ALBUMIN 3.4*    Recent Labs Lab 02/28/15 1545 02/28/15 1552 03/01/15 0230  WBC 5.6  --  8.3  NEUTROABS 2.4  --  6.1  HGB 13.0 13.6 11.9*  HCT 37.0* 40.0 34.5*  MCV 98.9  --  99.1  PLT 142*  --  123*   No results for input(s): CKTOTAL, CKMB, CKMBINDEX, TROPONINI in the last 168 hours.  Recent Labs  02/28/15 1545 03/01/15 0853 03/02/15 1148  LABPROT 18.6* 20.0* 19.0*   INR 1.54* 1.71* 1.59*   No results for input(s): COLORURINE, LABSPEC, PHURINE, GLUCOSEU, HGBUR, BILIRUBINUR, KETONESUR, PROTEINUR, UROBILINOGEN, NITRITE, LEUKOCYTESUR in the last 72 hours.  Invalid input(s): APPERANCEUR     Component Value Date/Time   CHOL 100 03/01/2015 0230   TRIG 68 03/01/2015 0230   HDL 33* 03/01/2015 0230   CHOLHDL 3.0 03/01/2015 0230   VLDL 14 03/01/2015 0230   LDLCALC 53 03/01/2015 0230   Lab Results  Component Value Date   HGBA1C 7.0* 03/01/2015   No results found for: LABOPIA, COCAINSCRNUR, LABBENZ, AMPHETMU, THCU, LABBARB  No results for input(s): ETH in the last 168 hours.   Imaging  CT head 03/01/15 1. Large right MCA infarct with cytotoxic edema and new intracranial mass effect. Leftward midline shift of 12 mm. No malignant hemorrhagic transformation. 2. Effaced right lateral ventricle. No ventriculomegaly. 3. Patchy cortically based infarcts also in the left ACA territory.  Ct Head Wo Contrast  02/28/2015    1. Stable noncontrast CT appearance of the brain since 1552 hours. ASPECTS score remains [place] [facility]roke [facility] Early CT Score Normal score = 10  2. Calcified atherosclerosis at the skullbase, specially the supraclinoid right ICA. Small chronic appearing focus of cortical encephalomalacia in the left occipital pole.  Ct Head Wo Contrast  02/28/2015    1. No acute intracranial abnormalities.    Portable Chest Xray 03/01/2015    1. Cardiomegaly and vascular congestion.  2. Slight interval improvement in left lower lobe aeration.     PHYSICAL EXAM Afebrile. Head is nontraumatic. Neck is supple without bruit.  Cardiac exam no murmur or gallop. Lungs are clear to auscultation. Distal pulses are well felt.   Neurological Exam :  Comatose and unresponsive. Eyes closed. Not following any commands. Right gaze deviation. Pupils small 2 mm and sluggishly reactive. Left facial droop. Motor system exam revealed dense left hemiplegia with  hypotonia. No spontaneous movements. Exam limited due to comfort care.  ASSESSMENT/PLAN Evan Oliver is a 79 y.o. male with history of hypertension, coronary artery disease, IHSS, diabetes mellitus, cardiomyopathy, atrial fibrillation on Coumadin therapy which had recently been held for a defibrillator exchange on 02/19/2015 presenting with collapse and left-sided weakness. He initially received IV TPA; however, this was discontinued secondary to a worsening in the patient's neurological status. The patient absolutely went to interventional radiology for a cerebral angiogram, thrombectomy and intra-arterial TPA.  Stroke:  Non-dominant infarct felt to be embolic secondary to atrial fibrillation.  LDL 53  HgbA1c 7.0 Diet NPO time specified  aspirin 81 mg orally every day and warfarin prior to admission, now on no antithrombotic secondary to comfort care  Palliative consult requested.  Cerebral edema  With midline shift  Family requested comfort care  Palliative care on board  Currently on morphine drip.  Hypertension  Home meds: Lopressor  Stable  Comfort care  Hyperlipidemia  Home meds:  Lipitor 40 mg daily   LDL 53, goal < 70  Diabetes  HgbA1c 7.0  Controlled  Other Stroke Risk Factors  Advanced age  Coronary artery disease  Other Active Problems  NPO  Elevated INR - s/p VitK  Mild anemia  Under comfort care measures  Other Pertinent History  Hospital day # 3  Rosalin Hawking, MD PhD Stroke Neurology 03/03/2015 12:34 PM    To contact Stroke Continuity provider, please refer to [email or web address] After hours, contact General Neurology

## 2015-03-03 NOTE — Progress Notes (Signed)
Report given to Brien Mates, RN ( [place])

## 2015-03-03 NOTE — Progress Notes (Signed)
Nutrition Brief Note  Chart reviewed. Pt now transitioning to comfort care.  No further nutrition interventions warranted at this time.  Please re-consult as needed.   Savannah Morford RD, LDN, CNSC [phone] Pager [phone] After Hours Pager    

## 2015-03-03 NOTE — Care Management Important Message (Signed)
Important Message  Patient Details  Name: Evan Oliver MRN: [id number] Date of Birth: September 29, 1935   Medicare Important Message Given:  Yes-second notification given    Nathen May 03/03/2015, 12:14 [place] Message  Patient Details  Name: Evan Oliver MRN: [id number] Date of Birth: 1936/03/02   Medicare Important Message Given:  Yes-second notification given    Nathen May 03/03/2015, 12:13 PM

## 2015-03-03 NOTE — Progress Notes (Signed)
NT suctioned copious amount of thick tan yellow secretions with multiple passes in both nares.

## 2015-03-03 NOTE — Progress Notes (Signed)
RT Note: RT checked in on patient this am. BBS are rhonchi. Patient was suctioned for a small amount of thick yellow secretions. Son in law was at bedside. Patient appears comfortable and in no apparent acute distress. He is non responsive but tolerated suctioning well with no issues. RT wrote our phone number of both the unit RT and Charge RT on the board so that the family can call us if needed and RT will continue to check on patient and assist as needed. Spo2-92% on 2lpm nasal cannula.

## 2015-03-03 NOTE — Progress Notes (Signed)
RT Note: Rt came to asses patient again this afternoon. He still appears to be comfortable and in no acute respiratory distress. He has the same respirations this afternoon as he did this morning ranging from 10-12 breaths per minute. Breath sounds show some very mild rhonchi on end exhalation but good air movement is noted and patient is taking deep breaths. Patients daughter and family is at bedside. I discussed this information with him. I do not feel that suctioning is warranted as of now and will actually make his situation worse. Suctioning is not indicated at this time but as mentioned to the family, we will check back in on him later and assist however we need to. Rt will continue to monitor.

## 2015-03-04 NOTE — Progress Notes (Signed)
Discharged patient to [facility] transported by [facility].

## 2015-03-16 DEATH — deceased

## 2016-05-01 IMAGING — CR DG CHEST 1V PORT
1 series · 1 of 1 positions shown · non-contrast
Comparison: None.

CLINICAL DATA: Acute respiratory failure, recent stroke

EXAM:
PORTABLE CHEST - 1 VIEW

[AP]
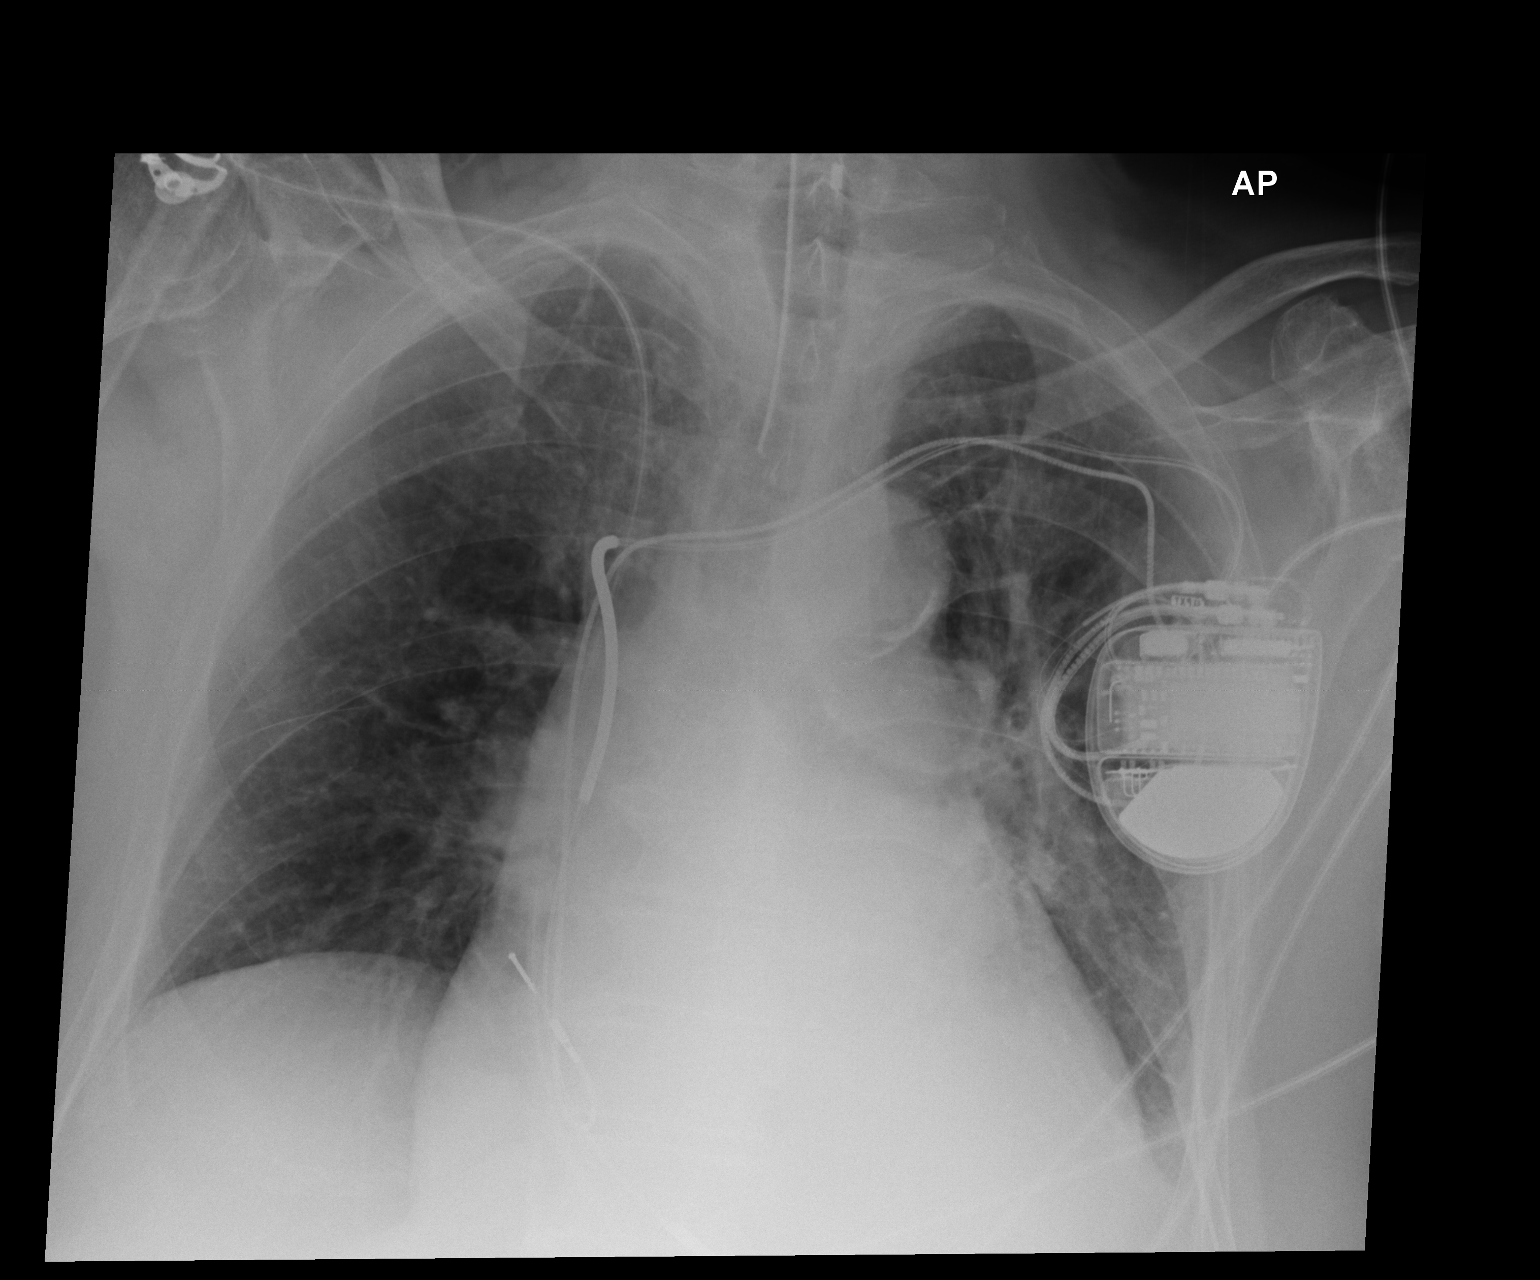

[1 of 1 positions shown; findings below may reference images not displayed]

FINDINGS: Cardiac shadow is mildly enlarged. A defibrillator is noted
endotracheal tube is noted in satisfactory position approximately
5.7 cm above the carina. Aortic calcifications are seen. No focal
infiltrate is noted.
IMPRESSION: No acute abnormality seen.

Aortic atherosclerotic disease.

## 2016-05-01 IMAGING — XA IR PERCUTANEOUS ART THORMBECTOMY/INFUSION INTRACRANIAL INCLUDE D
1 of 2 series · 10 of 24 positions shown · IV contrast (IODINE)
Comparison: none

CLINICAL DATA: Right gaze deviation.  Left-sided hemiplegia.

[Series 300: neuro · 10 of 202 slices shown]
[im 10/202]
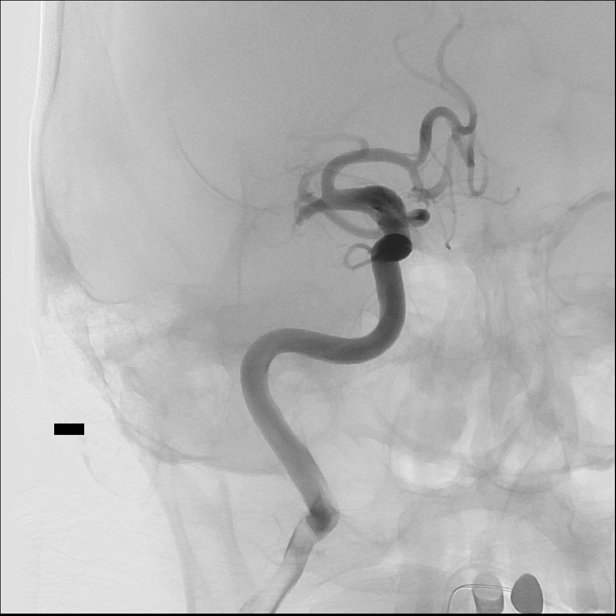
[im 28/202]
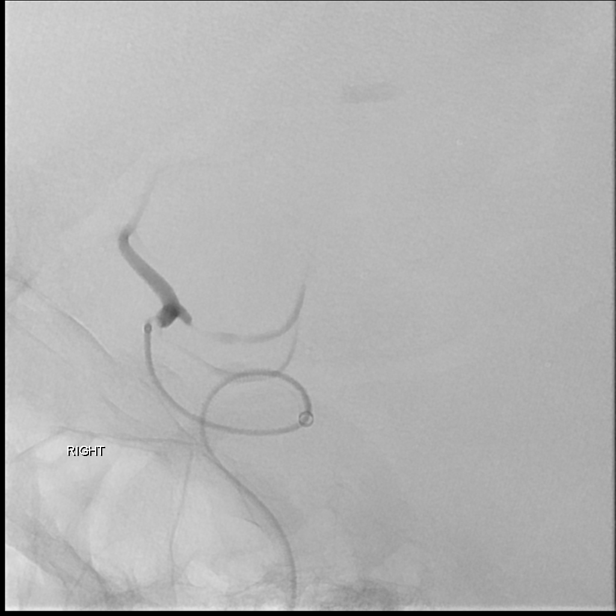
[im 55/202]
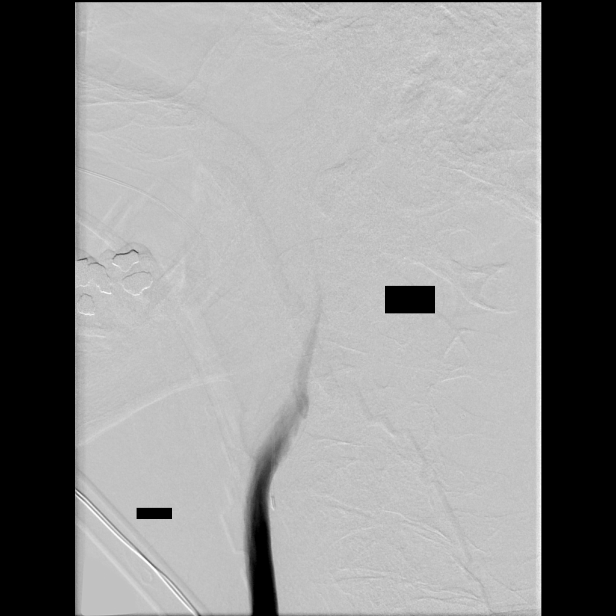
[im 74/202]
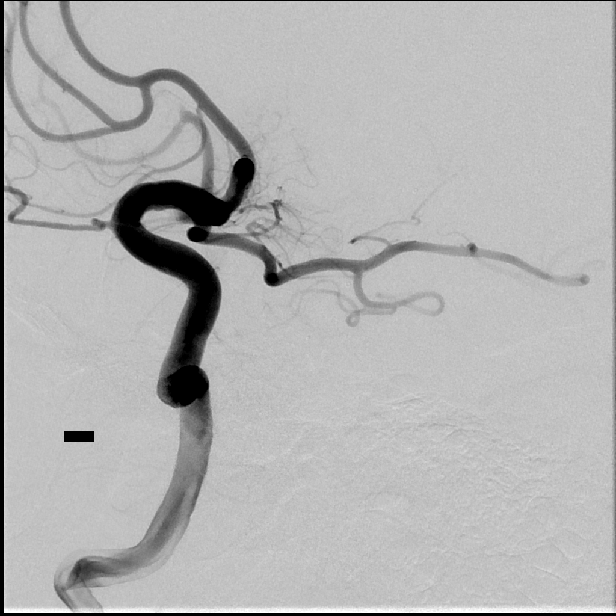
[im 92/202]
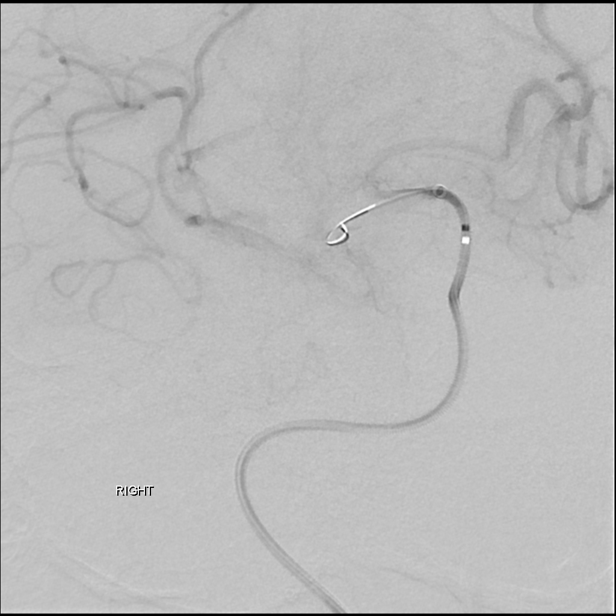
[im 119/202]
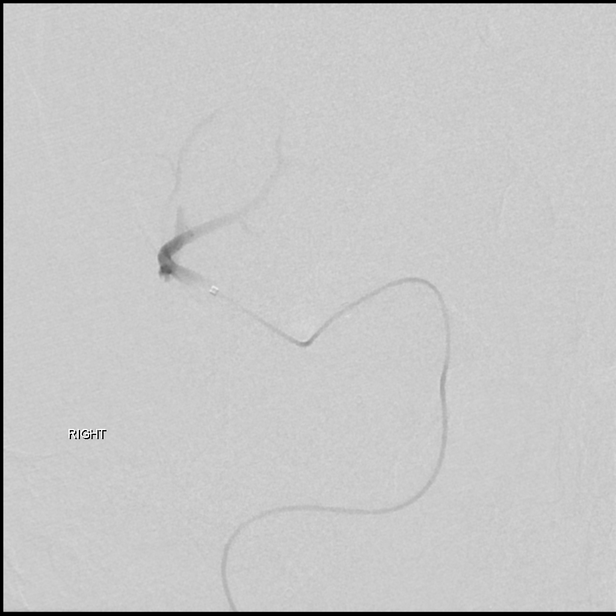
[im 138/202]
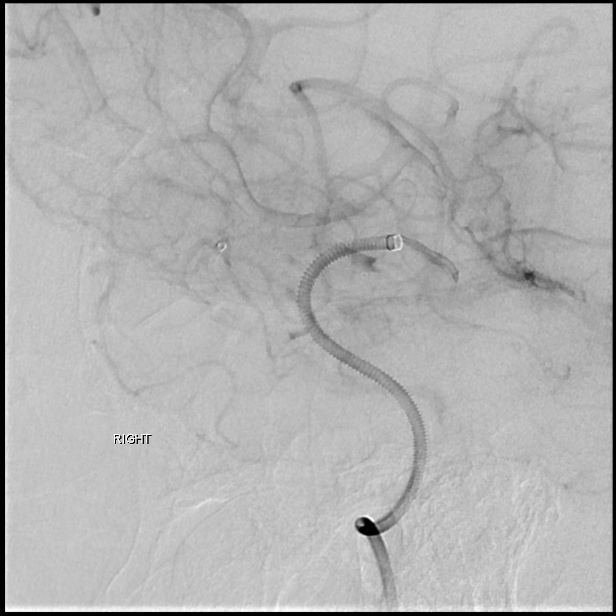
[im 165/202]
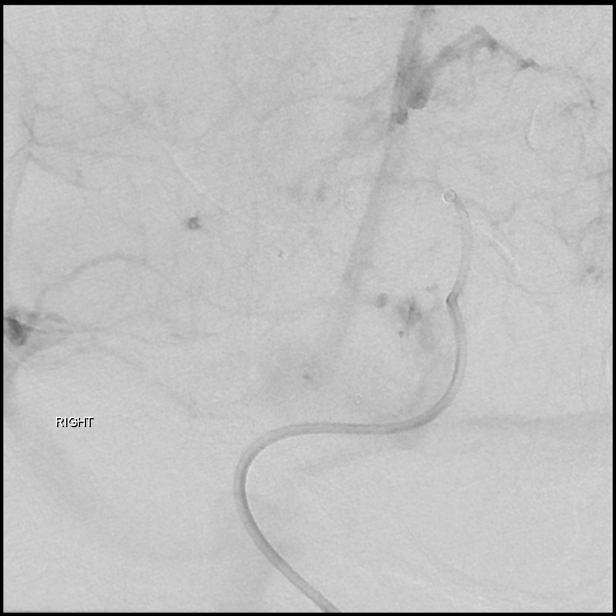
[im 183/202]
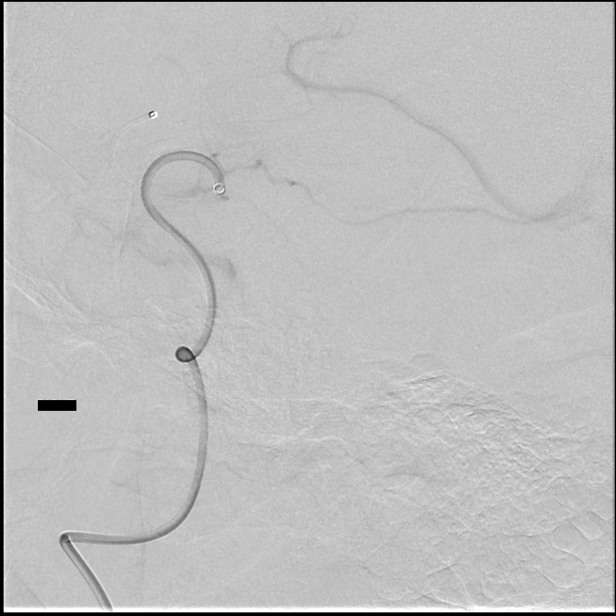
[im 202/202]
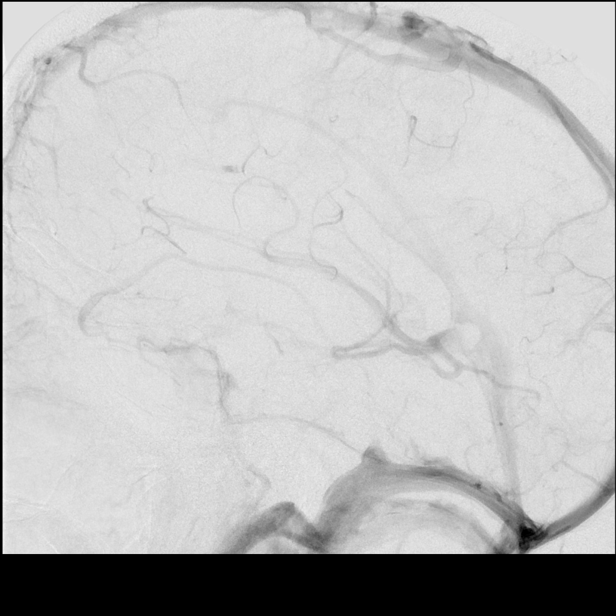

[10 of 24 positions shown; findings below may reference images not displayed]

EXAM:
IR PERCUTANEOUS ART THORMBECTOMY/INFUSION INTRACRANIAL INCLUDE DIAG
ANGIO. RIGHT COMMON CAROTID ARTERIOGRAM FOLLOWED BY ENDOVASCULAR
COMPLETE REVASCULARIZATION OF THE OCCLUDED RIGHT MIDDLE CEREBRAL
ARTERY USING SUPER SELECTIVE INTRACRANIAL INTRA-ARTERIAL TPA AND 1
PASS WITH THE SOLITAIRE FR 4 MM X 40 MM STENT RETRIEVAL DEVICE WITH
COMPLETE REVASCULARIZATION.

PROCEDURE:
Contrast: 50 mL OMNIPAQUE IOHEXOL 300 MG/ML  SOLN

Anesthesia/Sedation:  General anesthesia.

Medications: As per general anesthesia.

Following a full explanation of the procedure along with the
potential associated complications, an informed witnessed consent
was obtained. The procedure, benefits, risks of a 10% chance of a
intracranial hemorrhage, worsening neurological deficit, ventilator
dependency, and inability to revascularized and death were all
reviewed in detail. Informed consent was obtained from the patient's
daughter. The patient was put under general anesthesia by the
[REDACTED] at [HOSPITAL].

The right groin was prepped and draped in the usual sterile fashion.
Using modified Seldinger technique, transfemoral access into the
right common femoral artery was obtained without difficulty. Over a
0.035 inch guidewire a 5 French Pinnacle sheath was inserted.
Through this, and also over a 0.035 inch guidewire, a 5 French JB1
catheter was advanced to the aortic arch region and selectively
positioned in the innominate artery.

However, this was then exchanged over a 0.035 inch guidewire for a 5
Pan Nardone 2 diagnostic catheter. This was then advanced without
difficulty into the right common carotid artery. Arteriograms were
then perfor[REDACTED]ed over the carotid bifurcation and
intracranially.

There were no acute complications. The patient tolerated the
procedure well.
FINDINGS: The right common carotid arteriogram demonstrates mild narrowing at
the origin of the right external carotid artery. Its branches
opacify normally.

The right internal carotid artery at the bulb to the cranial skull
base opacifies normally.

The petrous, the cavernous and the supraclinoid segments are widely
patent.

A prominent right posterior communicating artery is seen opacifying
the right posterior cerebral artery distribution.

A torturous and complex right anterior cerebral artery A1 segment is
seen which divides into two branches supplying the right anterior
cerebral artery distribution.

There is a complete occlusion of the right anterior cerebral artery
A2 segment pericallosal artery at the level of the body of the
corpus callosum.

The right middle cerebral artery demonstrates complete occlusion in
its M1 segment.

The delayed arterial phase demonstrates attempts at the
leptomeningeal collateralization of the right middle cerebral artery
distribution with a large hypoperfusion deficit involving the right
MCA distribution, extending anteriorly to the anterior frontal
subcortical white matter.

ENDOVASCULAR COMPLETE REVASCULARIZATION OF OCCLUDED RIGHT MIDDLE
CEREBRAL ARTERY USING SUPER SELECTIVE INTRACRANIAL INTRA-ARTERIAL 10
MG OF TPA WITH 1 PASS WITH THE SOLITAIRE 4 MM X 40 MM RETRIEVAL
DEVICE WITH COMPLETE REVASCULARIZATION

The diagnostic JB1 catheter in the right common carotid artery was
exchanged over a Rosen exchange guidewire for a 7 French 90 cm Arrow
neurovascular sheath using biplane roadmap technique and constant
fluoroscopic guidance. Good aspiration was obtained from the side
port at the hub of the 7 French neurovascular sheath. Gentle
contrast injection demonstrated no evidence of spasms, dissections
or of intraluminal filling defects.

This was then connected to continuous heparinized saline infusion.

A combination of a 5 French Catalyst 132 cm guide catheter inside of
which was a Via 021 micro catheter was then advanced over a
inch Softip Synchro micro guidewire to the distal end of the 7
French guide sheath. Copious flushing with heparinized saline was a
performed through the triaxial system.

Using biplane roadmap technique and constant fluoroscopic guidance,
in a coaxial manner and with constant heparinized saline infusion,
the combination of the 5 French guide catheter with the Via 021
micro catheter was then advanced with the micro guidewire leading
with the J-tip configuration to avoid dissections or inducing spasm,
to the supraclinoid right ICA. The micro guidewire was then gently
advanced without difficulty through the occluded right middle
cerebral artery and advanced into the M2 region of the superior
division of the right middle cerebral artery.

The guidewire was removed. Core aspiration was noted through the
micro catheter.

This prompted the use of approximally 5 mg of super selective
intracranial intra-arterial tPA over 5 minutes and 5 cc of normal
saline.

At the end of this, aspiration was obtained without difficulty. The
micro guidewire was then advanced through the micro catheter more
distally followed by the micro catheter. The guidewire was removed.
After having confirmed good aspiration at the hub with micro
catheter, a 4 mm x 40 mm Solitaire FR stent retrieval device which
had been purged with heparinized saline was then advanced in a
coaxial manner to the distal end of the micro catheter. The entire
system was then straightened.

The O rings on the delivery micro guidewire and the delivery micro
catheter were loosened. Wth slight forward gentle traction with the
right hand on delivery micro guidewire, with the left hand the micro
catheter was retrieved proximally to deploy the retrieval device.

At this time the 5 French guide catheter was advanced into the the
right MCA M1 segment.

With constant aspiration with a 60 mL syringe on the 5 French guide
catheter side port at its hub, the combination of a retrieval device
in the micro catheter were then gently retrieved and removed.
Aspiration was continued, with a good back bleed at the hub of the 5
French guide catheter.

The aspirate revealed no presence of clots. However, the micro
catheter did have strings of clot.

A control arteriogram performed through the 5 French guide catheter
in the supraclinoid right ICA demonstrated significantly improved
flow through the superior and inferior divisions of the right middle
cerebral artery. The inferior division continued to have a
significant clot in the proximal M2 region.

This prompted the advancement of the micro catheter into the 5
French guide catheter over a 0.016 inch Headliner J-tip guidewire to
the distal end of the micro catheter.

The micro guidewire was then gently advanced and manipulated using a
torque device to advance it into the the prominent occluded inferior
division. The micro catheter was also advanced following the micro
guidewire.

The micro guidewire was then retrieved and removed. A control
arteriogram performed through the 5 French guide catheter
demonstrated complete revascularization of this inferior division.
However, there continued to be a filling defect nonocclusive more
proximally in the superior division.

This prompted the infusion of additional 5 mg of super selective
intracranial tPA in 5 cc of normal saline over about 5 minutes. A
control arteriogram performed through the 5 French guide catheter
demonstrated complete revascularization of the right MCA
distribution.

There continued to be no change in the distal right anterior
cerebral artery A2 segment pericallosal clot. Given the
collateralization from the callosum marginal artery and the distal
location and a tortuous blood vessel, it was decided to stop.

Following the procedure the patient's neurological status and
hemodynamic status remained stable.

The 5 French 132 cm Catalyst guide catheter was then retrieved into
the abdominal aorta as was the 7 French 90 cm Arrow neurovascular
sheath. This was then exchanged out for an 8 French Pinnacle sheath
over a 0.035 inch guidewire. The Pinnacle sheath was then connected
to continuous heparinized saline infusion. A CT of the brain was
then performed while the patient was on the table. This demonstrated
no gross intracranial hemorrhage.

The patient was then transported to the neuro ICU.
IMPRESSION: Status post endovascular near complete revascularization of occluded
right middle cerebral artery using super selective intracranial
intra-arterial 10 mg of tPA with 1 pass with the 4 mm x 40 mm
Solitaire FR stent retrieval device achieving Billiot 2b+
revascularization as described above.
